# Patient Record
Sex: Female | Born: 1935
Health system: Southern US, Community
[De-identification: ages and names within clinical notes are randomized; demographics above are authoritative.]

## PROBLEM LIST (undated history)

## (undated) DIAGNOSIS — G459 Transient cerebral ischemic attack, unspecified: Secondary | ICD-10-CM

## (undated) DIAGNOSIS — E785 Hyperlipidemia, unspecified: Secondary | ICD-10-CM

## (undated) DIAGNOSIS — E039 Hypothyroidism, unspecified: Secondary | ICD-10-CM

## (undated) DIAGNOSIS — I10 Essential (primary) hypertension: Secondary | ICD-10-CM

## (undated) DIAGNOSIS — D473 Essential (hemorrhagic) thrombocythemia: Secondary | ICD-10-CM

## (undated) DIAGNOSIS — D75839 Thrombocytosis, unspecified: Secondary | ICD-10-CM

## (undated) HISTORY — DX: Essential (primary) hypertension: I10

## (undated) HISTORY — PX: HERNIA REPAIR: SHX51

## (undated) HISTORY — DX: Essential (hemorrhagic) thrombocythemia: D47.3

## (undated) HISTORY — DX: Thrombocytosis, unspecified: D75.839

## (undated) HISTORY — DX: Hyperlipidemia, unspecified: E78.5

## (undated) HISTORY — DX: Hypothyroidism, unspecified: E03.9

## (undated) HISTORY — PX: ABDOMINAL HYSTERECTOMY: SHX81

## (undated) HISTORY — DX: Transient cerebral ischemic attack, unspecified: G45.9

---

## 2001-05-18 ENCOUNTER — Ambulatory Visit (HOSPITAL_COMMUNITY): Admission: RE | Admit: 2001-05-18 | Discharge: 2001-05-18 | Payer: Self-pay | Admitting: Internal Medicine

## 2001-05-18 ENCOUNTER — Encounter: Payer: Self-pay | Admitting: Internal Medicine

## 2002-07-26 DIAGNOSIS — G459 Transient cerebral ischemic attack, unspecified: Secondary | ICD-10-CM

## 2002-07-26 HISTORY — DX: Transient cerebral ischemic attack, unspecified: G45.9

## 2002-08-14 ENCOUNTER — Ambulatory Visit (HOSPITAL_COMMUNITY): Admission: RE | Admit: 2002-08-14 | Discharge: 2002-08-14 | Payer: Self-pay | Admitting: Internal Medicine

## 2002-08-14 ENCOUNTER — Encounter: Payer: Self-pay | Admitting: Internal Medicine

## 2003-08-20 ENCOUNTER — Ambulatory Visit (HOSPITAL_COMMUNITY): Admission: RE | Admit: 2003-08-20 | Discharge: 2003-08-20 | Payer: Self-pay | Admitting: Internal Medicine

## 2004-08-21 ENCOUNTER — Ambulatory Visit (HOSPITAL_COMMUNITY): Admission: RE | Admit: 2004-08-21 | Discharge: 2004-08-21 | Payer: Self-pay | Admitting: Internal Medicine

## 2005-08-06 ENCOUNTER — Ambulatory Visit (HOSPITAL_COMMUNITY): Admission: RE | Admit: 2005-08-06 | Discharge: 2005-08-06 | Payer: Self-pay | Admitting: Internal Medicine

## 2005-08-24 ENCOUNTER — Ambulatory Visit (HOSPITAL_COMMUNITY): Admission: RE | Admit: 2005-08-24 | Discharge: 2005-08-24 | Payer: Self-pay | Admitting: Internal Medicine

## 2007-05-22 ENCOUNTER — Encounter: Payer: Self-pay | Admitting: Emergency Medicine

## 2007-05-22 ENCOUNTER — Observation Stay (HOSPITAL_COMMUNITY): Admission: AD | Admit: 2007-05-22 | Discharge: 2007-05-23 | Payer: Self-pay | Admitting: Cardiology

## 2007-05-22 ENCOUNTER — Ambulatory Visit: Payer: Self-pay | Admitting: Cardiology

## 2007-05-25 ENCOUNTER — Ambulatory Visit: Payer: Self-pay

## 2007-05-26 ENCOUNTER — Ambulatory Visit (HOSPITAL_COMMUNITY): Admission: RE | Admit: 2007-05-26 | Discharge: 2007-05-26 | Payer: Self-pay | Admitting: Internal Medicine

## 2007-06-15 ENCOUNTER — Ambulatory Visit: Payer: Self-pay | Admitting: Cardiology

## 2007-07-24 ENCOUNTER — Ambulatory Visit: Payer: Self-pay | Admitting: Cardiology

## 2007-07-24 LAB — CONVERTED CEMR LAB: CRP, High Sensitivity: 1 (ref 0.00–5.00)

## 2007-09-06 ENCOUNTER — Ambulatory Visit: Payer: Self-pay | Admitting: Cardiology

## 2008-03-19 ENCOUNTER — Ambulatory Visit: Payer: Self-pay | Admitting: Cardiology

## 2008-04-09 ENCOUNTER — Encounter: Payer: Self-pay | Admitting: Cardiology

## 2008-04-09 ENCOUNTER — Ambulatory Visit: Payer: Self-pay

## 2008-09-17 ENCOUNTER — Ambulatory Visit: Payer: Self-pay | Admitting: Cardiology

## 2009-02-05 ENCOUNTER — Encounter: Payer: Self-pay | Admitting: Cardiology

## 2009-02-12 ENCOUNTER — Encounter: Payer: Self-pay | Admitting: Cardiology

## 2009-02-14 ENCOUNTER — Telehealth: Payer: Self-pay | Admitting: Cardiology

## 2009-04-05 DIAGNOSIS — E039 Hypothyroidism, unspecified: Secondary | ICD-10-CM

## 2009-04-05 DIAGNOSIS — E7800 Pure hypercholesterolemia, unspecified: Secondary | ICD-10-CM

## 2009-04-05 DIAGNOSIS — Z8679 Personal history of other diseases of the circulatory system: Secondary | ICD-10-CM | POA: Insufficient documentation

## 2009-04-05 DIAGNOSIS — I10 Essential (primary) hypertension: Secondary | ICD-10-CM | POA: Insufficient documentation

## 2009-04-23 ENCOUNTER — Ambulatory Visit: Payer: Self-pay | Admitting: Cardiology

## 2010-04-07 ENCOUNTER — Ambulatory Visit: Payer: Self-pay | Admitting: Cardiology

## 2010-08-13 ENCOUNTER — Ambulatory Visit (HOSPITAL_COMMUNITY)
Admission: RE | Admit: 2010-08-13 | Discharge: 2010-08-13 | Payer: Self-pay | Source: Home / Self Care | Attending: Internal Medicine | Admitting: Internal Medicine

## 2010-08-16 ENCOUNTER — Encounter: Payer: Self-pay | Admitting: Internal Medicine

## 2010-08-25 NOTE — Assessment & Plan Note (Signed)
Summary: F1Y   Visit Type:  1 year follow up Primary Provider:  fagan  CC:  palpitations.  History of Present Illness: The first seven months were not too good.  During the first of the year, had alot of palpitations.  Pulse would increase with no reason.  It would be regular and fast, but BP would be ok.  She has had some lightheadedness and tingling.  She would tingle in the left hand when her BP is being taken.  Sometimes she would have mild chest pressure.  She would feel both bad and weak.  Sometimes she would also ache.  In about two hours she would be quite a bit better.  Someitmes out of a sound sleep, she would have a strange feeling in her arm..  BP was variable through all of this time.  She went to see Dr. Willey Blade, and her Zestril was lowered.  She then was watching a pattern, and noticed the pressure was widening.  She started taking folic acid, and this has helped immensely.  SHe thinks she is better, quite honestly.  She stopped the Zestril for the past 4 or 5 days now without taking.  She really does feel  better.    Current Medications (verified): 1)  Hydrochlorothiazide 25 Mg Tabs (Hydrochlorothiazide) .... Take One Tablet By Mouth Daily. 2)  Garlic [id number] Mg Caps (Garlic) .... Take 1 Capsule By Mouth Once A Day 3)  Ocuvite  Tabs (Multiple Vitamins-Minerals) .... Take 1 Tablet By Mouth Once A Day 4)  Zinc 50 Mg Tabs (Zinc) .... 3 Times A Week 5)  Vitamin C 500 Mg  Tabs (Ascorbic Acid) .... Take 1 Tablet By Mouth Once A Day 6)  Zestril 20 Mg Tabs (Lisinopril) .... Take 1 Tablet By Mouth Once A Day Hold For About 4 Days 7)  Aspirin Ec 325 Mg Tbec (Aspirin) .... Take One Tablet By Mouth Daily 8)  Synthroid 125 Mcg Tabs (Levothyroxine Sodium) .... Take 1 Tablet By Mouth Once A Day 9)  Folic Acid [id number] Mcg Tabs (Folic Acid) .... Take 1 Tablet By Mouth Once A Day  Allergies: 1)  ! Simvastatin 2)  ! Zetia (Ezetimibe) 3)  ! Premarin 4)  ! Lipitor (Atorvastatin) 5)  ! * Red Yeast  Rice 6)  ! * Flax Seed Oil 7)  ! Laurina Bustle Odor  Past History:  Past Medical History: Last updated: 04/05/2009 Current Problems:  HYPERTENSION, UNSPECIFIED (ICD-401.9) PALPITATIONS, HX OF (ICD-V12.50) HYPERCHOLESTEROLEMIA (ICD-272.0) HYPOTHYROIDISM (ICD-244.9)  Past Surgical History: Last updated: 04/05/2009  SURGICAL HISTORY:   1. Hysterectomy.   2. Hernia repair.   3. Appendectomy.     Family History: Last updated: 04/05/2009  Mother had problems resulting in a pacemaker   implantation.  father died of a heart attack  Sibling with heart disease  status post bypass.      Social History: Last updated: 04/05/2009  She lives in [place] with her family.  She denies any   tobacco, EtOH, drug or herbal medication use.  She tries to follow a low  cholesterol diet.   Vital Signs:  Patient profile:   77 year old female Height:      61 inches Weight:      133.25 pounds BMI:     25.27 Pulse rate:   82 / minute Pulse rhythm:   regular Resp:     18 per minute BP sitting:   134 / 80  (left arm) Cuff size:   large  Vitals  Entered By: Stacie Garza (April 07, 2010 2:39 PM)  Physical Exam  General:  Well developed, well nourished, in no acute distress. Head:  normocephalic and atraumatic Eyes:  PERRLA/EOM intact; conjunctiva and lids normal. Lungs:  Clear bilaterally to auscultation and percussion. Heart:  PMI non displaced. Normal S1 and S2.  No murmur.   Abdomen:  Bowel sounds positive; abdomen soft and non-tender without masses, organomegaly, or hernias noted. No hepatosplenomegaly. Pulses:  pulses normal in all 4 extremities Extremities:  No clubbing or cyanosis. Neurologic:  Alert and oriented x 3.   EKG  Procedure date:  04/07/2010  Findings:      NSR.  LAD.  No acute changes.  Impression & Recommendations:  Problem # 1:  HYPERTENSION, UNSPECIFIED (ICD-401.9) Variable.  Reviewed in detail.  Encouraged close followup with Dr. Willey Blade. Her updated medication  list for this problem includes:    Hydrochlorothiazide 25 Mg Tabs (Hydrochlorothiazide) .Marland Kitchen... Take one tablet by mouth daily.    Zestril 20 Mg Tabs (Lisinopril) .Marland Kitchen... Take 1 tablet by mouth once a day hold for about 4 days    Aspirin Ec 325 Mg Tbec (Aspirin) .Marland Kitchen... Take one tablet by mouth daily  Orders: EKG w/ Interpretation (93000)  Problem # 2:  HYPERCHOLESTEROLEMIA (ICD-272.0) Conservative management.  See prior notes.  Problem # 3:  HYPOTHYROIDISM (ICD-244.9) on replacement per Dr. Willey Blade.  Her updated medication list for this problem includes:    Synthroid 125 Mcg Tabs (Levothyroxine sodium) .Marland Kitchen... Take 1 tablet by mouth once a day  Patient Instructions: 1)  Your physician recommends that you schedule a follow-up appointment as needed. 2)  Your physician recommends that you continue on your current medications as directed. Please refer to the Current Medication list given to you today.

## 2010-12-08 NOTE — Assessment & Plan Note (Signed)
[place]                            CARDIOLOGY OFFICE NOTE   MAILIN, PRESSMAN                           MRN:          [id number]  DATE:09/06/2007                            DOB:          1927-10-03    Stacie Garza is seen back today in followup.  Since I last saw her, she has  been sleeping better without any symptoms.  She is feeling largely  stronger.   PHYSICAL EXAMINATION:  VITAL SIGNS:  Blood pressure is 125/77, pulse is  83.  LUNGS:  The lung fields are clear.  CARDIAC:  Largely unchanged.   Overall, Stacie Garza is improved.  She would like to basically try and  manage her hypercholesterolemia without any medical intervention.  She  will remain on her current medications.  We will see her back in  followup perhaps in 6 months to a year.     Loretha Brasil. Lia Foyer, MD, [facility]  Electronically Signed    TDS/MedQ  DD: 12/11/2007  DT: 12/11/2007  Job #: [id number]

## 2010-12-08 NOTE — Letter (Signed)
September 17, 2008    [name]. Willey Blade, MD  [address]  [place], [place] [address]   RE:  Stacie Garza, Stacie Garza  MRN:  [id number]  /  DOB:  [birth year]   Dear Carloyn Manner,   I had the pleasure of seeing [facility] in the office today in a  followup visit.  In general, she thinks she is doing pretty well  overall.  She had developed a lot of flatus and burping which she  attributed to taking two aspirins a day, and she thinks in her digestion  system is changed overtime.  As a result, she cut down to one aspirin a  day.  She has had several episodes of chest discomfort, one episode woke  her up one November night.  She had some numbness and tingling and as if  something went up her arm and to her chest that last about 5-10 minutes,  then subsequently went away.  There was no diaphoresis, nausea or  vomiting, or other type of cardiac symptoms.  Sometime after Christmas,  she had another episode, she went to bed at 1030 without any difficulty  and woke up at 1215 with severe tightness in the chest and that lasted  about 5-10 minutes.  She has noticed a little bit more at times with  shoveling in the snow.  She feels good the rest of the time.   CURRENT MEDICATIONS:  1. Hydrochlorothiazide 25 mg daily.  2. Coreg daily.  3. Ocuvite daily.  4. Zinc 50 mg three times a week.  5. Vitamin C.  6. Zestril 20 mg a day.  7. Synthroid 112 mcg.  8. Aspirin 325 mg daily.   On physical, she is alert and oriented, in no distress.  Weight is 130  pounds and blood pressure 139/80.  The lung fields are clear.  The  cardiac rhythm is regular.  There is no significant gallop noted.   The electrocardiogram demonstrates normal sinus rhythm with left  oriented axis, otherwise unremarkable.   To summarize, she has had some recurrent episodes of chest discomfort.  She does not seem to concerned about and she had a negative radionuclide  imaging study previously, with a well-preserved ejection fraction.  We  have  talked in the past about possibility of cardiac catheterization,  but I certainly did not push her on this issue.  She personally thinks  that this is not cardiac related, and wonders whether she puts herself  under too much stress.  She would like to defer any other type of  evaluation at the present time.  She thinks if the episodes would  increase, and then she said she would call me and we would re-discuss  any kind of further evaluation.  I did suggest that she could probably  get the same antiplatelet effect with 81 mg and then some of her GI  symptoms could be related to the aspirin itself.  However, she wishes to  persist with 325.   We will see her back in followup in 6 months to a year, but I would be  happy to see her earlier further problems would arise.    Sincerely,      Loretha Brasil. Lia Foyer, MD, [facility]  Electronically Signed    TDS/MedQ  DD: 09/17/2008  DT: 09/18/2008  Job #: [id number]

## 2010-12-08 NOTE — Discharge Summary (Signed)
Stacie Garza, MCCLAINE                  ACCOUNT NO.:  [id number]   MEDICAL RECORD NO.:  [id number]          PATIENT TYPE:  INP   LOCATION:  3733                         FACILITY:  [facility]   PHYSICIAN:  Loretha Brasil. Lia Foyer, MD, FACCDATE OF BIRTH:  07-26-1928   DATE OF ADMISSION:  05/22/2007  DATE OF DISCHARGE:  05/23/2007                               DISCHARGE SUMMARY   PRIMARY CARDIOLOGIST:  Dr. Addison Lank.   PRIMARY CARE Jamilya Sarrazin:  Dr. Willey Blade.   PROCEDURES PERFORMED:  None.   FINAL DISCHARGE DIAGNOSES:  1. Chest pain, atypical.      a.     Pain located in the left back, left axillary discomfort,       with numbness and tingling in the left arm, worsened with       inspiration.  2. Dizziness, with and without statins.  3. History of hypertension.  4. Hypercholesterolemia not tolerant of statins.   HOSPITAL COURSE:  This is a very pleasant 77 year old Caucasian female  with no known history of coronary artery disease but with risk factors  to include family history of hypertension and hypercholesterolemia, who  was seen by her primary care physician, Dr. Willey Blade, with complaints of  dizziness and increased weakness.  The patient was referred to Dr.  Lia Foyer for further evaluation, and an appointment was scheduled with  him.  However, over the weekend prior to admission, she experienced  increased weakness and dizziness, and began to experience some left  shoulder discomfort associated with back pain and left axillary  discomfort, with some numbness and tingling in her left arm.  She states  that this has been going on intermittently for the last 2 months,  however over the weekend, the discomfort in her back and left arm would  actually wake her up, and it lasted approximately a day and a half, and  would move around on her back.  She does have a history of shingles, but  she felt like there was no break-out at that time, but she did have some  pain.  The patient went to [facility] for evaluation secondary  to the discomfort in her chest, back, and left arm.  EKG showed no acute  ST-T wave changes, and point of care markers were negative.  The patient  had a CT head done at [facility] revealing no acute findings.   The patient was transferred to [facility] for further  evaluation in the setting of chest discomfort, with strong family  history, and was seen and examined by Rosanne Sack, nurse  practitioner, and Kirk Ruths, cardiologist.  The patient was kept  overnight to rule out myocardial infarction and planned outpatient  Myoview if tests were found to be negative.   The patient did have some mild hypertension during hospitalization with  blood pressure in the 0000000 systolic.  Normally, it runs in the 120s.  She is on antihypertensive medications.  Cardiac enzymes are found to be  negative at 0.01, and 0.01 with CK of 40 and 42 respectively, and MB of  0.7 and  0.7 respectively.  D-dimer was found to be 0.40.  The patient's  potassium was found to be within normal range at 3.5.  EKG revealed  sinus rhythm with left anterior fascicular block and frequent PVCs.   The patient was planned for adenosine stress Myoview on the morning of  May 23, 2007, however the patient was not discharged in time to  undergo this test today, and therefore it has been rescheduled for later  this week.  The patient has had no recurrence of chest discomfort, and  also confirms that the patient has been stable throughout the last 24  hours.  The patient is anxious to go home, and is willing to return for  stress test on Thursday.  Patient has been seen and examined by Dr. Addison Lank, and he has also interviewed her as well, and agrees with  planned outpatient stress Myoview.   Labs on discharge are sodium 136, potassium 3.5, chloride 100, CO2 26,  BUN 15, creatinine 0.7, glucose 90, hemoglobin 15.2, hematocrit 45.6,  white blood cells 6.2, platelets 667.   D-dimer 0.40, troponins as stated  above negative x2.  EKG reveals sinus rhythm with frequent PVCs.  Chest  x-ray revealed bibasilar atelectasis.   DISCHARGE MEDICATIONS:  1. Aspirin 325 mg one p.o. daily.  2. Protonix 40 mg one p.o. daily.  3. Hydrochlorothiazide 25 mg daily.  4. Lisinopril 20 mg daily.  5. Levothyroxine 125 mcg daily.   ALLERGIES:  SIMVASTATIN, ESTROGENS, CONJUGATED; ZETIA.   FOLLOWUP PLANS AND APPOINTMENTS:  1. The patient will follow up with stress Myoview per Dr. Lia Foyer on      May 25, 2007 at 9:15.  2. The patient will follow up with Dr. Lia Foyer for followup      appointment on June 15, 2007.  3. We will follow up with CBC prior to discharge secondary to high      platelet count and possible need for referral to hematologist if it      remains elevated.  4. The patient is to follow up with Dr. Willey Blade, her primary care      physician, for continued medical management.   Time spent with the patient to include physician time 50 minutes.      Phill Myron. Purcell Nails, NP      Loretha Brasil. Lia Foyer, MD, [facility]  Electronically Signed    KML/MEDQ  D:  05/23/2007  T:  05/23/2007  Job:  [id number]   cc:   [name]. Willey Blade, MD

## 2010-12-08 NOTE — H&P (Signed)
Stacie Garza, Stacie Garza                  ACCOUNT NO.:  [id number]   MEDICAL RECORD NO.:  [id number]          PATIENT TYPE:  INP   LOCATION:  3733                         FACILITY:  [place]   PHYSICIAN:  Denice Bors. Stanford Breed, MD, FACCDATE OF BIRTH:  [birth year]   DATE OF ADMISSION:  05/22/2007  DATE OF DISCHARGE:                              HISTORY & PHYSICAL   PRIMARY CARDIOLOGIST:  Loretha Brasil. Lia Foyer, MD, F.A.C.C.   PRIMARY CARE PHYSICIAN:  [name]. Willey Blade, M.D.   HISTORY OF PRESENT ILLNESS:  Ms. Sweazy is a pleasant 77 year old female  with no known history of coronary artery disease who initially presented  to her primary care physician over the last few weeks complaining of  dizziness and increased weakness.  Ms. Lass had been referred to Dr.  Lia Foyer for further evaluation after that appointment was scheduled with  him for workup.  However this past weekend she experienced the increased  weakness with dizziness and then began to experience some left shoulder  discomfort associated with back pain and left axillary discomfort.  She  states this has been going on intermittently for approximately two  months.  However, this weekend the discomfort in her back and left  axillary woke her up.  Sunday same scenario with increased weakness.  She states she stayed in bed all day.  She is a rather poor historian.  Apparently the discomfort was localized to her back, but then radiated  around to her rib cage and then under her left breast some.  She states  it is difficult to take a deep breath.  The dizziness is not new.  This  has been going on for several months.  The comfort in her chest and  back, however, is new.  As I stated earlier, it is worse with movement.  The discomfort in her back is worse with movement.  Today she still  having the chest discomfort and back discomfort and it is worse with a  deep breath.  Around 3:30 this morning she woke up with some left arm  numbness, fell back to sleep  and woke up again at 6:30 a.m. with the  same complaint, decided to be evaluated and presented to [facility].  There EKG was done that showed no acute ST or T-wave changes.  Point of care markers were negative.  Blood pressure was checked.  The  patient also had orthostatics.  Lying blood pressure 111/73 with a heart  rate of 78, sitting 115/75 with a heart rate of 96 and standing blood  pressure 125/80 with a heart rate of 112.  Ms. Jerome also had a CT of the  head done at [facility] that showed no acute findings.   ALLERGIES:  She is intolerant of PREMARIN, STATINS, FLAXSEED OIL, ZETIA  RED YEAST RICE, AND LIPITOR.  She states the statins cause muscle  discomfort and Premarin causes loss of memory.   MEDICATIONS AT HOME:  Doses unclear, the patient cannot recall, but she  is taking:  1. Synthroid.  2. HCTZ.  3. Zestril.  4  Aspirin.  Apparently she is taking two 325 mg aspirin daily.  1. She is also taking zinc.  2. Garlic.  3. Multivitamin.  4. Vitamin C.   PAST MEDICAL HISTORY:  1. Hypertension.  2. Hypothyroidism.  3. High cholesterol.  4. Palpitations/PVCs.   SURGICAL HISTORY:  1. Hysterectomy.  2. Hernia repair.  3. Appendectomy.   SOCIAL HISTORY:  She lives in [place] with her family.  She denies any  tobacco, EtOH, drug or herbal medication use.  She tries to follow a low  cholesterol diet.   FAMILY HISTORY:  Mother had problems resulting in a pacemaker  implantation.  Father had heart disease.  Sibling with heart disease  status post bypass.   REVIEW OF SYSTEMS:  Positive for chest pain, palpitations, dizziness,  numbness in left arm and shoulder, myalgia, arthralgias in knees and  ache in back as described above and intermittent nausea.   PHYSICAL EXAMINATION:  VITAL SIGNS:  Temperature 96.9, respirations 20,  blood pressure 120/70.  She satting 95% on room air.  GENERALLY:  She is in no acute distress.  HEENT:  Normocephalic, atraumatic.  Pupils  equal, round, react to light.  Sclerae is clear.  NECK:  Supple without bruits or JVD.  CARDIOVASCULAR:  Exam reveals S1, S2 regular rate and rhythm.  She has a  2/6 systolic ejection murmur.  LUNGS: Clear to auscultation.  SKIN:  Warm and dry.  ABDOMEN:  Soft, nontender, positive bowel sounds.  LOWER EXTREMITIES: Without clubbing, cyanosis or edema.  NEUROLOGICALLY:  Alert and oriented x3.  MUSCULOSKELETAL: Without effusions or CVA tenderness.   LABORATORY DATA:  Chest x-ray done at [facility] showed bronchitic  changes with linear subsegmental atelectasis at both lung bases.  CT of  the head without acute findings.  EKG at [facility] showed normal sinus  rhythm with left anterior fascicular block.  No ST changes.  Baseline  lab work obtained at [facility] shows H&H 15.2 and 45.6, WBC 6.2,  platelets 667,000.  Sodium 140, potassium 3.9, BUN and creatinine 13 and  0.8, glucose 114.  D-dimer 0.40.  Point of care less than 0.05 troponin.   Dr. Kirk Ruths in to examine and assess patient with complaints of  atypical chest and back discomfort persistent greater than 48 hours, EKG  without acute ST or T-wave changes.  Initial enzymes negative.  Pain  very  atypical.  It may be musculoskeletal.  The patient will be admitted for  observation, rule out MI.  If enzymes negative, proceed with outpatient  Myoview echo and carotid Dopplers per Dr. Maren Beach request.  Will keep  the patient n.p.o. overnight.  Hopefully can obtain a stress Myoview at  our office in the a.m.      Rosanne Sack, ACNP      Denice Bors. Stanford Breed, MD, [facility]  Electronically Signed    MB/MEDQ  D:  05/22/2007  T:  05/23/2007  Job:  [id number]

## 2010-12-08 NOTE — Letter (Signed)
March 19, 2008    [name]. Willey Blade, MD  [address]  [place], [place] [address]   RE:  Stacie, Garza  MRN:  [id number]  /  DOB:  November 04, 1927   Dear Carloyn Manner,   I had the pleasure of seeing [name] in the office today in a  followup visit.  In general, she says she is doing a lot better than she  did a year ago.  She does note that she does not wake up easily.  She  relates this did effect that her father died of a heart attack many  years ago.  She says that she will feel a little bit of a tightness  across the chest and neck and that will go away and has gone by about 9  to 9:30, rarely lasts very long.  As you know, she had had a  radionuclide imaging study in October of last year, and this did not  suggest high-grade ischemia.  She actually thinks this is fairly minor  and wonders whether it could be related factor that her thyroid was  recently slightly overactive.  I know that her dose has been cut back to  12 mcg daily for a TSH that was slightly low.  She says she feels her  palpitations at that time is well.  When she is up at around in the  afternoon, she has no exertional chest discomfort.   Today on examination, she was alert and oriented.  The weight was 129,  blood pressure 122/70, the pulse was 69.  The lung fields were clear and  the cardiac rhythm was regular.   The electrocardiogram demonstrates normal sinus rhythm with a rare PVC.  There is a leftward-oriented axis with left anterior fascicular block.   Her recent studies include a normal BUN and creatinine, potassium of  4.4, cholesterol now of 198 with an LDL of 135.  Her platelet count was  elevated at 531, which has been previously noted, and her TSH was 0.103.   [name] and I had a thorough discussion today.  I brought up the issue  a possible consideration of cardiac catheterization if her symptoms  persist particularly, but she has not really interested in this at the  present time.  She really felt that  this was fairly minor.  If her  symptoms do not improve with backing off of her Synthroid, then it would  be worthwhile to consider.  We will go ahead and get a 2-D  echocardiogram which has not been done previously, and I will have her  return to clinic in 4-6 months.  With regard to her lipid abnormalities,  certainly I would defer to your judgment.  I do appreciate the  opportunity of sharing in this nice lady's care and she is to call us  promptly if there is a change in her status.    Sincerely,      Loretha Brasil. Lia Foyer, MD, [facility]  Electronically Signed    TDS/MedQ  DD: 03/19/2008  DT: 03/20/2008  Job #: [id number]

## 2010-12-08 NOTE — Letter (Signed)
June 15, 2007    Stacie Garza. Stacie Blade, MD  [address]  [place], [place] [address]   RE:  Stacie Garza, Stacie Garza  MRN:  [id number]  /  DOB:  April 17, 1928   Dear Stacie Garza:   I had the pleasure of seeing Stacie Garza in the office today in followup.  I spent nearly an hour with she and her daughter going over all of their  recent laboratory studies in detail.  She actually thinks a lavender  pillow that she got nearly a year ago might be the source of her  problem, and she is feeling much back to normal.  Her foggy-headedness,  dizziness, and other symptoms seem to be rather substantially improved.  As you know, she has had some other studies done.  You have gotten  carotid studies and these have demonstrated significant plaque formation  bilaterally, the carotid bulbs.  She did undergo radionuclide imaging  study, and this demonstrated ejection fraction of 85%.  There was normal  to hyperdynamic regional and global left ventricular function.  She did  have some mild chest discomfort, but no significant ST segment change.  The findings were suggestive of some tissue attenuation without a large  degree of ischemia.   Fortunately, she is significantly better.   MEDICATIONS:  1. Aspirin 325 mg daily.  2. Protonix 40 mg daily.  3. Hydrochlorothiazide 25 mg daily.  4. Lisinopril 20 mg daily.  5. Levothyroxine 125 mcg daily.  6. Garlic 1 daily.  7. Ocuvite multivitamin daily.  8. Zinc 50 mg 3 times a week.  9. Vitamin C 500 mg daily.   PHYSICAL EXAMINATION:  She is alert and oriented in no acute distress.  Blood pressure is 127/70, pulse is 76.  There are no carotid bruits noted.   I went through things with her in some great detail.  She has, as you  know, evidence of hypercholesterolemia with an LDL of 148, and evidence  of significant bilateral plaque.  She does not have a perfusion defect,  but I did clearly express to her the fact that this does not exclude  underlying coronary disease,  given the extent of the carotid disease, I  thought that it would be worthwhile again to consider the issue of  statins.  She has not had a very good experience with them.  We talked  about it, the number of the trials, including Reversal and Asteroid.  She seemed to be reasonably open minded to the possibility of taking a  statin drug.  She also brought in to me her platelet counts which have  been significantly elevated since 1962.  This is somewhat comforting.   We have talked about the possibility of initiating therapy with Crestor  5 mg.  We will get a CRP and we will see what the results of this are  initially, first.  We did, also, have a long discussion regarding the  Jupiter trial results.  I appreciate the opportunity of sharing in her  care, and will communicate with you if we see her back.    Sincerely,      Stacie Garza. Stacie Foyer, MD, [facility]  Electronically Signed    TDS/MedQ  DD: 06/16/2007  DT: 06/17/2007  Job #: [id number]

## 2011-05-05 LAB — DIFFERENTIAL
Basophils Relative: 1
Lymphocytes Relative: 24
Monocytes Absolute: 0.7
Monocytes Relative: 12 — ABNORMAL HIGH
Neutro Abs: 3.8
Neutrophils Relative %: 61

## 2011-05-05 LAB — COMPREHENSIVE METABOLIC PANEL
Albumin: 3.7
Alkaline Phosphatase: 98
BUN: 13
Calcium: 8.7
Creatinine, Ser: 0.83
Glucose, Bld: 114 — ABNORMAL HIGH
Total Protein: 6.8

## 2011-05-05 LAB — BASIC METABOLIC PANEL
CO2: 26
Calcium: 8.3 — ABNORMAL LOW
Creatinine, Ser: 0.73
GFR calc Af Amer: >60
GFR calc non Af Amer: >60
Glucose, Bld: 90
Sodium: 136

## 2011-05-05 LAB — CARDIAC PANEL(CRET KIN+CKTOT+MB+TROPI)
CK, MB: 0.7
Total CK: 40
Total CK: 42
Total CK: 45
Troponin I: 0.01

## 2011-05-05 LAB — POCT CARDIAC MARKERS
CKMB, poc: <1 — ABNORMAL LOW
Myoglobin, poc: 48.4
Operator id: 264761

## 2011-05-05 LAB — HEMOGLOBIN A1C
Hgb A1c MFr Bld: 5.8
Mean Plasma Glucose: 129

## 2011-05-05 LAB — CBC
HCT: 45.6
Hemoglobin: 15.2 — ABNORMAL HIGH
MCHC: 33.4
MCV: 96.6
Platelets: 667 — ABNORMAL HIGH
RDW: 13.3

## 2011-09-01 DIAGNOSIS — E039 Hypothyroidism, unspecified: Secondary | ICD-10-CM | POA: Diagnosis not present

## 2011-09-09 DIAGNOSIS — E039 Hypothyroidism, unspecified: Secondary | ICD-10-CM | POA: Diagnosis not present

## 2011-11-30 DIAGNOSIS — E039 Hypothyroidism, unspecified: Secondary | ICD-10-CM | POA: Diagnosis not present

## 2011-12-17 DIAGNOSIS — E039 Hypothyroidism, unspecified: Secondary | ICD-10-CM | POA: Diagnosis not present

## 2012-01-31 DIAGNOSIS — Z961 Presence of intraocular lens: Secondary | ICD-10-CM | POA: Diagnosis not present

## 2012-01-31 DIAGNOSIS — H52209 Unspecified astigmatism, unspecified eye: Secondary | ICD-10-CM | POA: Diagnosis not present

## 2012-02-01 DIAGNOSIS — C44320 Squamous cell carcinoma of skin of unspecified parts of face: Secondary | ICD-10-CM | POA: Diagnosis not present

## 2012-02-01 DIAGNOSIS — L821 Other seborrheic keratosis: Secondary | ICD-10-CM | POA: Diagnosis not present

## 2012-02-01 DIAGNOSIS — D485 Neoplasm of uncertain behavior of skin: Secondary | ICD-10-CM | POA: Diagnosis not present

## 2012-02-01 DIAGNOSIS — Z85828 Personal history of other malignant neoplasm of skin: Secondary | ICD-10-CM | POA: Diagnosis not present

## 2012-02-01 DIAGNOSIS — D239 Other benign neoplasm of skin, unspecified: Secondary | ICD-10-CM | POA: Diagnosis not present

## 2012-02-29 DIAGNOSIS — C44320 Squamous cell carcinoma of skin of unspecified parts of face: Secondary | ICD-10-CM | POA: Diagnosis not present

## 2012-03-01 DIAGNOSIS — Z79899 Other long term (current) drug therapy: Secondary | ICD-10-CM | POA: Diagnosis not present

## 2012-03-01 DIAGNOSIS — E785 Hyperlipidemia, unspecified: Secondary | ICD-10-CM | POA: Diagnosis not present

## 2012-03-01 DIAGNOSIS — E039 Hypothyroidism, unspecified: Secondary | ICD-10-CM | POA: Diagnosis not present

## 2012-03-01 DIAGNOSIS — I10 Essential (primary) hypertension: Secondary | ICD-10-CM | POA: Diagnosis not present

## 2012-03-09 DIAGNOSIS — E785 Hyperlipidemia, unspecified: Secondary | ICD-10-CM | POA: Diagnosis not present

## 2012-03-09 DIAGNOSIS — E039 Hypothyroidism, unspecified: Secondary | ICD-10-CM | POA: Diagnosis not present

## 2012-03-09 DIAGNOSIS — I10 Essential (primary) hypertension: Secondary | ICD-10-CM | POA: Diagnosis not present

## 2012-07-12 DIAGNOSIS — I10 Essential (primary) hypertension: Secondary | ICD-10-CM | POA: Diagnosis not present

## 2012-07-12 DIAGNOSIS — E039 Hypothyroidism, unspecified: Secondary | ICD-10-CM | POA: Diagnosis not present

## 2012-07-12 DIAGNOSIS — Z0000 Encounter for general adult medical examination without abnormal findings: Secondary | ICD-10-CM | POA: Diagnosis not present

## 2012-07-12 DIAGNOSIS — E785 Hyperlipidemia, unspecified: Secondary | ICD-10-CM | POA: Diagnosis not present

## 2012-07-31 DIAGNOSIS — Z23 Encounter for immunization: Secondary | ICD-10-CM | POA: Diagnosis not present

## 2012-09-05 DIAGNOSIS — E039 Hypothyroidism, unspecified: Secondary | ICD-10-CM | POA: Diagnosis not present

## 2013-01-31 DIAGNOSIS — Z961 Presence of intraocular lens: Secondary | ICD-10-CM | POA: Diagnosis not present

## 2013-01-31 DIAGNOSIS — H52209 Unspecified astigmatism, unspecified eye: Secondary | ICD-10-CM | POA: Diagnosis not present

## 2013-02-27 DIAGNOSIS — I10 Essential (primary) hypertension: Secondary | ICD-10-CM | POA: Diagnosis not present

## 2013-02-27 DIAGNOSIS — E785 Hyperlipidemia, unspecified: Secondary | ICD-10-CM | POA: Diagnosis not present

## 2013-02-27 DIAGNOSIS — E039 Hypothyroidism, unspecified: Secondary | ICD-10-CM | POA: Diagnosis not present

## 2013-03-06 ENCOUNTER — Other Ambulatory Visit (HOSPITAL_COMMUNITY): Payer: Self-pay | Admitting: Family Medicine

## 2013-03-06 DIAGNOSIS — Z139 Encounter for screening, unspecified: Secondary | ICD-10-CM

## 2013-03-06 DIAGNOSIS — E039 Hypothyroidism, unspecified: Secondary | ICD-10-CM | POA: Diagnosis not present

## 2013-03-06 DIAGNOSIS — Z23 Encounter for immunization: Secondary | ICD-10-CM | POA: Diagnosis not present

## 2013-03-06 DIAGNOSIS — Z0000 Encounter for general adult medical examination without abnormal findings: Secondary | ICD-10-CM | POA: Diagnosis not present

## 2013-03-06 DIAGNOSIS — I10 Essential (primary) hypertension: Secondary | ICD-10-CM | POA: Diagnosis not present

## 2013-03-13 ENCOUNTER — Ambulatory Visit (HOSPITAL_COMMUNITY)
Admission: RE | Admit: 2013-03-13 | Discharge: 2013-03-13 | Disposition: A | Discharge status: Home / Self Care | Payer: Medicare Other | Source: Ambulatory Visit | Attending: Family Medicine | Admitting: Family Medicine

## 2013-03-13 DIAGNOSIS — Z1231 Encounter for screening mammogram for malignant neoplasm of breast: Secondary | ICD-10-CM | POA: Insufficient documentation

## 2013-03-13 DIAGNOSIS — Z139 Encounter for screening, unspecified: Secondary | ICD-10-CM

## 2013-05-15 DIAGNOSIS — Z23 Encounter for immunization: Secondary | ICD-10-CM | POA: Diagnosis not present

## 2013-06-12 DIAGNOSIS — L723 Sebaceous cyst: Secondary | ICD-10-CM | POA: Diagnosis not present

## 2013-06-12 DIAGNOSIS — Z85828 Personal history of other malignant neoplasm of skin: Secondary | ICD-10-CM | POA: Diagnosis not present

## 2013-06-12 DIAGNOSIS — L570 Actinic keratosis: Secondary | ICD-10-CM | POA: Diagnosis not present

## 2013-09-04 DIAGNOSIS — I10 Essential (primary) hypertension: Secondary | ICD-10-CM | POA: Diagnosis not present

## 2013-09-04 DIAGNOSIS — E785 Hyperlipidemia, unspecified: Secondary | ICD-10-CM | POA: Diagnosis not present

## 2013-09-04 DIAGNOSIS — E039 Hypothyroidism, unspecified: Secondary | ICD-10-CM | POA: Diagnosis not present

## 2013-09-04 DIAGNOSIS — D473 Essential (hemorrhagic) thrombocythemia: Secondary | ICD-10-CM | POA: Diagnosis not present

## 2013-09-05 DIAGNOSIS — D473 Essential (hemorrhagic) thrombocythemia: Secondary | ICD-10-CM | POA: Diagnosis not present

## 2013-09-05 DIAGNOSIS — E039 Hypothyroidism, unspecified: Secondary | ICD-10-CM | POA: Diagnosis not present

## 2013-09-05 DIAGNOSIS — E785 Hyperlipidemia, unspecified: Secondary | ICD-10-CM | POA: Diagnosis not present

## 2013-09-05 DIAGNOSIS — I10 Essential (primary) hypertension: Secondary | ICD-10-CM | POA: Diagnosis not present

## 2013-10-08 DIAGNOSIS — D473 Essential (hemorrhagic) thrombocythemia: Secondary | ICD-10-CM | POA: Diagnosis not present

## 2013-12-19 DIAGNOSIS — Z85828 Personal history of other malignant neoplasm of skin: Secondary | ICD-10-CM | POA: Diagnosis not present

## 2013-12-19 DIAGNOSIS — L723 Sebaceous cyst: Secondary | ICD-10-CM | POA: Diagnosis not present

## 2013-12-19 DIAGNOSIS — L255 Unspecified contact dermatitis due to plants, except food: Secondary | ICD-10-CM | POA: Diagnosis not present

## 2013-12-19 DIAGNOSIS — D239 Other benign neoplasm of skin, unspecified: Secondary | ICD-10-CM | POA: Diagnosis not present

## 2013-12-19 DIAGNOSIS — L570 Actinic keratosis: Secondary | ICD-10-CM | POA: Diagnosis not present

## 2014-02-11 DIAGNOSIS — Z961 Presence of intraocular lens: Secondary | ICD-10-CM | POA: Diagnosis not present

## 2014-02-11 DIAGNOSIS — H52209 Unspecified astigmatism, unspecified eye: Secondary | ICD-10-CM | POA: Diagnosis not present

## 2014-03-12 DIAGNOSIS — Z0000 Encounter for general adult medical examination without abnormal findings: Secondary | ICD-10-CM | POA: Diagnosis not present

## 2014-03-12 DIAGNOSIS — I10 Essential (primary) hypertension: Secondary | ICD-10-CM | POA: Diagnosis not present

## 2014-03-12 DIAGNOSIS — E039 Hypothyroidism, unspecified: Secondary | ICD-10-CM | POA: Diagnosis not present

## 2014-03-12 DIAGNOSIS — E785 Hyperlipidemia, unspecified: Secondary | ICD-10-CM | POA: Diagnosis not present

## 2014-03-19 DIAGNOSIS — E782 Mixed hyperlipidemia: Secondary | ICD-10-CM | POA: Diagnosis not present

## 2014-03-19 DIAGNOSIS — I10 Essential (primary) hypertension: Secondary | ICD-10-CM | POA: Diagnosis not present

## 2014-03-19 DIAGNOSIS — D473 Essential (hemorrhagic) thrombocythemia: Secondary | ICD-10-CM | POA: Diagnosis not present

## 2014-04-09 DIAGNOSIS — E785 Hyperlipidemia, unspecified: Secondary | ICD-10-CM | POA: Diagnosis not present

## 2014-04-09 DIAGNOSIS — I10 Essential (primary) hypertension: Secondary | ICD-10-CM | POA: Diagnosis not present

## 2014-04-09 DIAGNOSIS — E039 Hypothyroidism, unspecified: Secondary | ICD-10-CM | POA: Diagnosis not present

## 2014-04-15 DIAGNOSIS — D691 Qualitative platelet defects: Secondary | ICD-10-CM | POA: Diagnosis not present

## 2014-04-15 DIAGNOSIS — R0989 Other specified symptoms and signs involving the circulatory and respiratory systems: Secondary | ICD-10-CM | POA: Diagnosis not present

## 2014-04-15 DIAGNOSIS — D473 Essential (hemorrhagic) thrombocythemia: Secondary | ICD-10-CM | POA: Diagnosis not present

## 2014-04-18 DIAGNOSIS — I4891 Unspecified atrial fibrillation: Secondary | ICD-10-CM | POA: Diagnosis not present

## 2014-04-18 DIAGNOSIS — I10 Essential (primary) hypertension: Secondary | ICD-10-CM | POA: Diagnosis not present

## 2014-04-18 DIAGNOSIS — R9431 Abnormal electrocardiogram [ECG] [EKG]: Secondary | ICD-10-CM | POA: Diagnosis not present

## 2014-04-18 DIAGNOSIS — R42 Dizziness and giddiness: Secondary | ICD-10-CM | POA: Diagnosis not present

## 2014-04-25 DIAGNOSIS — G459 Transient cerebral ischemic attack, unspecified: Secondary | ICD-10-CM | POA: Diagnosis not present

## 2014-05-07 DIAGNOSIS — I10 Essential (primary) hypertension: Secondary | ICD-10-CM | POA: Diagnosis not present

## 2014-05-07 DIAGNOSIS — E039 Hypothyroidism, unspecified: Secondary | ICD-10-CM | POA: Diagnosis not present

## 2014-05-07 DIAGNOSIS — Z23 Encounter for immunization: Secondary | ICD-10-CM | POA: Diagnosis not present

## 2014-05-07 DIAGNOSIS — E782 Mixed hyperlipidemia: Secondary | ICD-10-CM | POA: Diagnosis not present

## 2014-05-09 DIAGNOSIS — G459 Transient cerebral ischemic attack, unspecified: Secondary | ICD-10-CM | POA: Diagnosis not present

## 2014-05-28 DIAGNOSIS — D473 Essential (hemorrhagic) thrombocythemia: Secondary | ICD-10-CM | POA: Diagnosis not present

## 2014-05-28 DIAGNOSIS — R42 Dizziness and giddiness: Secondary | ICD-10-CM | POA: Diagnosis not present

## 2014-06-04 DIAGNOSIS — I10 Essential (primary) hypertension: Secondary | ICD-10-CM | POA: Diagnosis not present

## 2014-06-04 DIAGNOSIS — E785 Hyperlipidemia, unspecified: Secondary | ICD-10-CM | POA: Diagnosis not present

## 2014-06-04 DIAGNOSIS — E039 Hypothyroidism, unspecified: Secondary | ICD-10-CM | POA: Diagnosis not present

## 2014-06-10 DIAGNOSIS — E782 Mixed hyperlipidemia: Secondary | ICD-10-CM | POA: Diagnosis not present

## 2014-06-10 DIAGNOSIS — I10 Essential (primary) hypertension: Secondary | ICD-10-CM | POA: Diagnosis not present

## 2014-06-11 DIAGNOSIS — I6782 Cerebral ischemia: Secondary | ICD-10-CM | POA: Diagnosis not present

## 2014-06-11 DIAGNOSIS — R42 Dizziness and giddiness: Secondary | ICD-10-CM | POA: Diagnosis not present

## 2014-06-25 DIAGNOSIS — D239 Other benign neoplasm of skin, unspecified: Secondary | ICD-10-CM | POA: Diagnosis not present

## 2014-06-25 DIAGNOSIS — L570 Actinic keratosis: Secondary | ICD-10-CM | POA: Diagnosis not present

## 2014-06-25 DIAGNOSIS — L821 Other seborrheic keratosis: Secondary | ICD-10-CM | POA: Diagnosis not present

## 2014-06-25 DIAGNOSIS — L723 Sebaceous cyst: Secondary | ICD-10-CM | POA: Diagnosis not present

## 2014-06-25 DIAGNOSIS — Z808 Family history of malignant neoplasm of other organs or systems: Secondary | ICD-10-CM | POA: Diagnosis not present

## 2014-06-27 DIAGNOSIS — I10 Essential (primary) hypertension: Secondary | ICD-10-CM | POA: Diagnosis not present

## 2014-06-29 DIAGNOSIS — H18831 Recurrent erosion of cornea, right eye: Secondary | ICD-10-CM | POA: Diagnosis not present

## 2014-07-01 DIAGNOSIS — H18831 Recurrent erosion of cornea, right eye: Secondary | ICD-10-CM | POA: Diagnosis not present

## 2014-07-03 DIAGNOSIS — R2689 Other abnormalities of gait and mobility: Secondary | ICD-10-CM | POA: Diagnosis not present

## 2014-07-08 DIAGNOSIS — I10 Essential (primary) hypertension: Secondary | ICD-10-CM | POA: Diagnosis not present

## 2014-09-03 DIAGNOSIS — Z87898 Personal history of other specified conditions: Secondary | ICD-10-CM | POA: Diagnosis not present

## 2014-09-03 DIAGNOSIS — I110 Hypertensive heart disease with heart failure: Secondary | ICD-10-CM | POA: Diagnosis not present

## 2014-10-08 DIAGNOSIS — R7301 Impaired fasting glucose: Secondary | ICD-10-CM | POA: Diagnosis not present

## 2014-10-08 DIAGNOSIS — E039 Hypothyroidism, unspecified: Secondary | ICD-10-CM | POA: Diagnosis not present

## 2014-10-08 DIAGNOSIS — I10 Essential (primary) hypertension: Secondary | ICD-10-CM | POA: Diagnosis not present

## 2014-10-09 DIAGNOSIS — D691 Qualitative platelet defects: Secondary | ICD-10-CM | POA: Diagnosis not present

## 2014-10-09 DIAGNOSIS — D473 Essential (hemorrhagic) thrombocythemia: Secondary | ICD-10-CM | POA: Diagnosis not present

## 2014-10-14 DIAGNOSIS — Z6823 Body mass index (BMI) 23.0-23.9, adult: Secondary | ICD-10-CM | POA: Diagnosis not present

## 2014-10-14 DIAGNOSIS — E039 Hypothyroidism, unspecified: Secondary | ICD-10-CM | POA: Diagnosis not present

## 2014-10-14 DIAGNOSIS — R7301 Impaired fasting glucose: Secondary | ICD-10-CM | POA: Diagnosis not present

## 2014-10-14 DIAGNOSIS — I10 Essential (primary) hypertension: Secondary | ICD-10-CM | POA: Diagnosis not present

## 2014-11-12 DIAGNOSIS — E039 Hypothyroidism, unspecified: Secondary | ICD-10-CM | POA: Diagnosis not present

## 2014-11-12 DIAGNOSIS — R7301 Impaired fasting glucose: Secondary | ICD-10-CM | POA: Diagnosis not present

## 2014-11-12 DIAGNOSIS — E782 Mixed hyperlipidemia: Secondary | ICD-10-CM | POA: Diagnosis not present

## 2014-11-12 DIAGNOSIS — I10 Essential (primary) hypertension: Secondary | ICD-10-CM | POA: Diagnosis not present

## 2014-12-25 DIAGNOSIS — L821 Other seborrheic keratosis: Secondary | ICD-10-CM | POA: Diagnosis not present

## 2014-12-25 DIAGNOSIS — D225 Melanocytic nevi of trunk: Secondary | ICD-10-CM | POA: Diagnosis not present

## 2014-12-25 DIAGNOSIS — L570 Actinic keratosis: Secondary | ICD-10-CM | POA: Diagnosis not present

## 2014-12-25 DIAGNOSIS — Z85828 Personal history of other malignant neoplasm of skin: Secondary | ICD-10-CM | POA: Diagnosis not present

## 2014-12-25 DIAGNOSIS — Z808 Family history of malignant neoplasm of other organs or systems: Secondary | ICD-10-CM | POA: Diagnosis not present

## 2015-02-17 DIAGNOSIS — Z961 Presence of intraocular lens: Secondary | ICD-10-CM | POA: Diagnosis not present

## 2015-02-17 DIAGNOSIS — H524 Presbyopia: Secondary | ICD-10-CM | POA: Diagnosis not present

## 2015-03-25 DIAGNOSIS — R0602 Shortness of breath: Secondary | ICD-10-CM | POA: Diagnosis not present

## 2015-03-25 DIAGNOSIS — R42 Dizziness and giddiness: Secondary | ICD-10-CM | POA: Diagnosis not present

## 2015-03-25 DIAGNOSIS — I10 Essential (primary) hypertension: Secondary | ICD-10-CM | POA: Diagnosis not present

## 2015-04-01 DIAGNOSIS — R42 Dizziness and giddiness: Secondary | ICD-10-CM | POA: Diagnosis not present

## 2015-04-01 DIAGNOSIS — R0602 Shortness of breath: Secondary | ICD-10-CM | POA: Diagnosis not present

## 2015-04-01 DIAGNOSIS — I10 Essential (primary) hypertension: Secondary | ICD-10-CM | POA: Diagnosis not present

## 2015-04-04 DIAGNOSIS — E785 Hyperlipidemia, unspecified: Secondary | ICD-10-CM | POA: Diagnosis not present

## 2015-04-04 DIAGNOSIS — I10 Essential (primary) hypertension: Secondary | ICD-10-CM | POA: Diagnosis not present

## 2015-04-04 DIAGNOSIS — E039 Hypothyroidism, unspecified: Secondary | ICD-10-CM | POA: Diagnosis not present

## 2015-04-04 DIAGNOSIS — R7301 Impaired fasting glucose: Secondary | ICD-10-CM | POA: Diagnosis not present

## 2015-04-07 DIAGNOSIS — R7301 Impaired fasting glucose: Secondary | ICD-10-CM | POA: Diagnosis not present

## 2015-04-07 DIAGNOSIS — I10 Essential (primary) hypertension: Secondary | ICD-10-CM | POA: Diagnosis not present

## 2015-04-07 DIAGNOSIS — E785 Hyperlipidemia, unspecified: Secondary | ICD-10-CM | POA: Diagnosis not present

## 2015-04-07 DIAGNOSIS — E039 Hypothyroidism, unspecified: Secondary | ICD-10-CM | POA: Diagnosis not present

## 2015-04-22 DIAGNOSIS — I10 Essential (primary) hypertension: Secondary | ICD-10-CM | POA: Diagnosis not present

## 2015-08-28 DIAGNOSIS — D485 Neoplasm of uncertain behavior of skin: Secondary | ICD-10-CM | POA: Diagnosis not present

## 2015-08-28 DIAGNOSIS — D225 Melanocytic nevi of trunk: Secondary | ICD-10-CM | POA: Diagnosis not present

## 2015-08-28 DIAGNOSIS — L820 Inflamed seborrheic keratosis: Secondary | ICD-10-CM | POA: Diagnosis not present

## 2015-08-28 DIAGNOSIS — Z808 Family history of malignant neoplasm of other organs or systems: Secondary | ICD-10-CM | POA: Diagnosis not present

## 2015-08-28 DIAGNOSIS — Z23 Encounter for immunization: Secondary | ICD-10-CM | POA: Diagnosis not present

## 2015-08-28 DIAGNOSIS — L821 Other seborrheic keratosis: Secondary | ICD-10-CM | POA: Diagnosis not present

## 2015-08-28 DIAGNOSIS — Z85828 Personal history of other malignant neoplasm of skin: Secondary | ICD-10-CM | POA: Diagnosis not present

## 2015-10-02 DIAGNOSIS — E039 Hypothyroidism, unspecified: Secondary | ICD-10-CM | POA: Diagnosis not present

## 2015-10-02 DIAGNOSIS — I10 Essential (primary) hypertension: Secondary | ICD-10-CM | POA: Diagnosis not present

## 2015-10-02 DIAGNOSIS — R7301 Impaired fasting glucose: Secondary | ICD-10-CM | POA: Diagnosis not present

## 2015-10-02 DIAGNOSIS — E782 Mixed hyperlipidemia: Secondary | ICD-10-CM | POA: Diagnosis not present

## 2015-10-07 DIAGNOSIS — R7301 Impaired fasting glucose: Secondary | ICD-10-CM | POA: Diagnosis not present

## 2015-10-07 DIAGNOSIS — D696 Thrombocytopenia, unspecified: Secondary | ICD-10-CM | POA: Diagnosis not present

## 2015-10-07 DIAGNOSIS — D691 Qualitative platelet defects: Secondary | ICD-10-CM | POA: Diagnosis not present

## 2015-10-07 DIAGNOSIS — E782 Mixed hyperlipidemia: Secondary | ICD-10-CM | POA: Diagnosis not present

## 2015-10-07 DIAGNOSIS — D473 Essential (hemorrhagic) thrombocythemia: Secondary | ICD-10-CM | POA: Diagnosis not present

## 2015-10-07 DIAGNOSIS — I10 Essential (primary) hypertension: Secondary | ICD-10-CM | POA: Diagnosis not present

## 2015-10-07 DIAGNOSIS — E039 Hypothyroidism, unspecified: Secondary | ICD-10-CM | POA: Diagnosis not present

## 2015-10-07 DIAGNOSIS — G459 Transient cerebral ischemic attack, unspecified: Secondary | ICD-10-CM | POA: Diagnosis not present

## 2015-10-16 DIAGNOSIS — R079 Chest pain, unspecified: Secondary | ICD-10-CM | POA: Diagnosis not present

## 2015-10-16 DIAGNOSIS — I10 Essential (primary) hypertension: Secondary | ICD-10-CM | POA: Diagnosis not present

## 2015-10-16 DIAGNOSIS — R42 Dizziness and giddiness: Secondary | ICD-10-CM | POA: Diagnosis not present

## 2015-11-05 DIAGNOSIS — I10 Essential (primary) hypertension: Secondary | ICD-10-CM | POA: Diagnosis not present

## 2015-11-05 DIAGNOSIS — R7301 Impaired fasting glucose: Secondary | ICD-10-CM | POA: Diagnosis not present

## 2015-11-05 DIAGNOSIS — E039 Hypothyroidism, unspecified: Secondary | ICD-10-CM | POA: Diagnosis not present

## 2015-11-11 DIAGNOSIS — G459 Transient cerebral ischemic attack, unspecified: Secondary | ICD-10-CM | POA: Diagnosis not present

## 2015-11-11 DIAGNOSIS — R42 Dizziness and giddiness: Secondary | ICD-10-CM | POA: Diagnosis not present

## 2015-11-11 DIAGNOSIS — I10 Essential (primary) hypertension: Secondary | ICD-10-CM | POA: Diagnosis not present

## 2015-11-11 DIAGNOSIS — Z789 Other specified health status: Secondary | ICD-10-CM | POA: Diagnosis not present

## 2015-11-12 ENCOUNTER — Other Ambulatory Visit: Payer: Self-pay | Admitting: Neurology

## 2015-11-13 ENCOUNTER — Encounter: Payer: Self-pay | Admitting: Neurology

## 2015-11-13 ENCOUNTER — Ambulatory Visit (INDEPENDENT_AMBULATORY_CARE_PROVIDER_SITE_OTHER): Payer: Medicare Other | Admitting: Neurology

## 2015-11-13 VITALS — BP 140/85 | HR 83 | Ht 61.0 in | Wt 127.8 lb

## 2015-11-13 DIAGNOSIS — R2689 Other abnormalities of gait and mobility: Secondary | ICD-10-CM | POA: Diagnosis not present

## 2015-11-13 DIAGNOSIS — E039 Hypothyroidism, unspecified: Secondary | ICD-10-CM

## 2015-11-13 DIAGNOSIS — D473 Essential (hemorrhagic) thrombocythemia: Secondary | ICD-10-CM

## 2015-11-13 DIAGNOSIS — I10 Essential (primary) hypertension: Secondary | ICD-10-CM | POA: Diagnosis not present

## 2015-11-13 DIAGNOSIS — R42 Dizziness and giddiness: Secondary | ICD-10-CM

## 2015-11-13 DIAGNOSIS — E785 Hyperlipidemia, unspecified: Secondary | ICD-10-CM

## 2015-11-13 NOTE — Patient Instructions (Addendum)
-   will do MRI brain and MRA head for further evaluation - will repeat carotid doppler to evaluate neck vessels - will do TCD emboli monitoring due to high platelet count - will do sleep study to rule out sleep apnea - continue ASA for stroke prevention - will consider injection medication for cholesterol if LDL is still higher than 100 - check BP at home and record - Follow up with your primary care physician for stroke risk factor modification. Recommend maintain blood pressure goal 120-140/80, diabetes with hemoglobin A1c goal below 6.5% and lipids with LDL cholesterol goal below 100 mg/dL.  - PT therapy for balance  - follow up in 1-2 months.

## 2015-11-14 DIAGNOSIS — R519 Headache, unspecified: Secondary | ICD-10-CM | POA: Insufficient documentation

## 2015-11-14 DIAGNOSIS — R42 Dizziness and giddiness: Secondary | ICD-10-CM | POA: Diagnosis not present

## 2015-11-14 DIAGNOSIS — R51 Headache: Secondary | ICD-10-CM

## 2015-11-14 DIAGNOSIS — I10 Essential (primary) hypertension: Secondary | ICD-10-CM | POA: Insufficient documentation

## 2015-11-14 DIAGNOSIS — E039 Hypothyroidism, unspecified: Secondary | ICD-10-CM | POA: Insufficient documentation

## 2015-11-14 DIAGNOSIS — E785 Hyperlipidemia, unspecified: Secondary | ICD-10-CM | POA: Insufficient documentation

## 2015-11-14 DIAGNOSIS — D473 Essential (hemorrhagic) thrombocythemia: Secondary | ICD-10-CM | POA: Insufficient documentation

## 2015-11-14 DIAGNOSIS — R2689 Other abnormalities of gait and mobility: Secondary | ICD-10-CM | POA: Insufficient documentation

## 2015-11-14 MED ORDER — ALPRAZOLAM 0.5 MG PO TABS: 0.5000 mg | ORAL_TABLET | Freq: Once | ORAL | Status: DC | PRN

## 2015-11-14 NOTE — Progress Notes (Signed)
NEUROLOGY CLINIC NEW PATIENT NOTE  NAME: Stacie Garza DOB: 1928/05/27 REFERRING PHYSICIAN: Anders Simmonds, MD  I saw Stacie Garza as a new consult in the neurovascular clinic today regarding  Chief Complaint  Patient presents with  . Referral    from Dr. Almira Coaster MD, for dizzy spells,  unstable on feet, and bears to the left alot when ambulating, staggering,headaches occasionally,  .  HPI: Stacie Garza is a 82 y.o. female with PMH of HTN, hypothyroidism, HLD who presents as a new patient for dizziness.   Pt stated that started in 2015, she occasional has lightheadedness on getting up from chair or bed. In 01/2014, she had episode of chest pain during reading, nausea but no vomiting, went to bed, and felt good the second day. Went to see Dr. Hamilton Capri and extensive cardiac work up all negative. Since then, she started to have intermittent lightheadedness on walking, motion related, felt foggy headed and staggering, could lasting a whole day long. Initially, once every a couple of months, but since 05/2015, it increased to once every month. Since this year, it is more frequent, and now became all the time whenever she starts to walk, unstable with gait and like to hold onto things, fear of falling, however, she never had fall and never used any cane or walker, denies HA. She never felt lightheadedness when she is sitting or lying. She also complains of CP on wakening up in the middle of night, once every 2 weeks since this year. She had follow up with Dr. Hamilton Capri and also did 24 hour Holter monitoring was negative. She was referred to neurology for further evaluation.  She has hx of HTN and on azilsartan, BP today 140/85. HLD but not tolerating statins, LDL 157 in 08/2013. Hypothyroidism on synthroid. She also has essential thrombocythemia followed with Dr. Tressie Stalker and JAK2 was positive. She is on ASA, last platelet level 600 in 09/2015. She denies hx of stroke. Denies any  depression or anxiety.  She has CUS in 2015 which was unremarkable. Denies smoking or alcohol. Mom died of old age, and dad died of heart disease at age of 9, sister died of cancer.   Past Medical History  Diagnosis Date  . Hypertension   . Hypothyroidism   . Dizzy    Past Surgical History  Procedure Laterality Date  . Hernia repair     History reviewed. No pertinent family history. Current Outpatient Prescriptions  Medication Sig Dispense Refill  . aspirin 325 MG tablet Take by mouth.    . Azilsartan Medoxomil 40 MG TABS Take by mouth.    . diphenhydrAMINE (BENADRYL) 25 MG tablet Take by mouth every 6 (six) hours as needed.     . folic acid (FOLVITE) A999333 MCG tablet Take by mouth.    . levothyroxine (SYNTHROID, LEVOTHROID) 175 MCG tablet Take by mouth.    . multivitamin-lutein (OCUVITE-LUTEIN) CAPS capsule Take by mouth.    [name] Glycol-Propyl Glycol 0.4-0.3 % SOLN     . vitamin B-12 (CYANOCOBALAMIN) 1000 MCG tablet Take by mouth.    . vitamin C (ASCORBIC ACID) 500 MG tablet Take by mouth.    . zinc gluconate 50 MG tablet Take by mouth.    . ALPRAZolam (XANAX) 0.5 MG tablet Take 1 tablet (0.5 mg total) by mouth once as needed for anxiety (MRI test). 2 tablet 0   No current facility-administered medications for this visit.   Allergies  Allergen Reactions  . Flaxseed (  Linseed) Itching, Other (See Comments) and Rash    Blood in stool  . Chlorthalidone Other (See Comments)    Dry eye (Eyelid stuck to eyeball)  . Lavender Oil Other (See Comments)    Head felt dizzy and foggy, left arm pain, fatigue, off balance  . Metoprolol Other (See Comments)    Headache,  Insomnia, Unstable gait, Lethargy, intermittent SOB.  (Last dose 07/24/2014)  . Monascus Purpureus [employer] Other (See Comments) and Nausea Only    Off balance, head felt heavy, aching in shoulders, fatigue, tightness in chest  . Statins Other (See Comments)    Pain in back of head and neck, UTI, back pain  .  Typhoid Vaccines Other (See Comments)    redness  . Atorvastatin   . Bio-Flax   . Conjugated Estrogens   . Ezetimibe   . Simvastatin    Social History   Social History  . Marital Status: Divorced    Spouse Name: N/A  . Number of Children: N/A  . Years of Education: N/A   Occupational History  . Not on file.   Social History Main Topics  . Smoking status: Never Smoker   . Smokeless tobacco: Not on file  . Alcohol Use: No  . Drug Use: Not on file  . Sexual Activity: Not on file   Other Topics Concern  . Not on file   Social History Narrative  . No narrative on file    Review of Systems Full 14 system review of systems performed and notable only for those listed, all others are neg:  Constitutional:  Weight loss, fatigue Cardiovascular: chest pain Ear/Nose/Throat:  Hearing loss Skin:  Eyes:   Respiratory:  SOB Gastroitestinal:   Genitourinary:  Hematology/Lymphatic:   Endocrine:  Musculoskeletal:   Allergy/Immunology:   Neurological:  Memory loss, HA, weakness, dizziness Psychiatric: decreased energy Sleep: snoring   Physical Exam  Filed Vitals:   11/13/15 1150  BP: 140/85  Pulse: 83    General - Well nourished, well developed, in no apparent distress.  Ophthalmologic - Sharp disc margins OU.  Cardiovascular - Regular rate and rhythm with no murmur.   Neck - supple, no nuchal rigidity .  Mental Status -  Level of arousal and orientation to time, place, and person were intact. Language including expression, naming, repetition, comprehension, reading, and writing was assessed and found intact. Attention span and concentration were normal. Recent and remote memory were intact, 3/3 registration and 3/3 delayed recall Fund of Knowledge was assessed and was intact.  Cranial Nerves II - XII - II - Visual field intact OU. III, IV, VI - Extraocular movements intact. V - Facial sensation intact bilaterally. VII - Facial movement intact  bilaterally. VIII - Hearing & vestibular intact bilaterally. X - Palate elevates symmetrically. XI - Chin turning & shoulder shrug intact bilaterally. XII - Tongue protrusion intact.  Motor Strength - The patient's strength was normal in all extremities and pronator drift was absent.  Bulk was normal and fasciculations were absent.   Motor Tone - Muscle tone was assessed at the neck and appendages and was normal.  Reflexes - The patient's reflexes were normal in all extremities and she had no pathological reflexes.  Sensory - Light touch, temperature/pinprick, vibration and proprioception were assessed and were normal.  Romberg test showed teetering but able to correct herself, no fall.   Coordination - The patient had normal movements in the hands and feet with no ataxia or dysmetria.  Tremor was  absent.  Gait and Station - on walking, very cautious gait, no arm swing, body tense, prefer to hold wall or bed. However, when encounter completed, pt walks out of the office and walks in the hallway with completely normal gait and able to maneuver avoid objects in the hallway without difficulty.   Imaging 06/11/14 MRI brain - Mild chronic small vessel ischemic changes. Otherwise normal. 06/11/14 CUS - 1. No hemodynamically significant stenosis on either side 2. Both vertebral arteries are patent with antegrade flow.  Lab Review 10/07/15 platelet 600 05/28/14 ANA neg, B12 1198 (on B12 1033mcg daily), RPR neg JAK2 V617F, Rfx CALR/E12/MPL5     Assessment and Plan:   In summary, Stacie Garza is a 82 y.o. female with PMH of HTN, hypothyroidism, HLD, thrombocytosis (possible essential thrombocythemia) who presents as a new patient for chronic lightheadedness. Initially intermittent but gradually increased frequency and now all the time, but only happens with walking and motion related. Exam showed more functional component. Her condition more likely chronic subjective dizziness, but due to risk  factors of HTN, HLD not tolerance to statin, essential thrombocythemia, will rule out stroke first. But will refer to PT for balance training at meantime.   - will do MRI brain and MRA head for further evaluation - will repeat carotid doppler - will do TCD emboli monitoring due to essential thrombocythemia - will do sleep study to rule out sleep apnea - continue ASA for stroke prevention - will consider injection medication for cholesterol if LDL is still higher than 100 - check BP at home and record - Follow up with your primary care physician for stroke risk factor modification. Recommend maintain blood pressure goal 120-140/80, diabetes with hemoglobin A1c goal below 6.5% and lipids with LDL cholesterol goal below 100 mg/dL.  - PT therapy for balance  - follow up in 1-2 months.  A total of 45 minutes was spent face-to-face with this patient. Over half this time was spent on counseling patient on the dizziness diagnosis and different diagnostic and therapeutic options available.   Thank you very much for the opportunity to participate in the care of this patient.  Please do not hesitate to call if any questions or concerns arise.  Orders Placed This Encounter  Procedures  . MR Brain Wo Contrast    Standing Status: Future     Number of Occurrences:      Standing Expiration Date: 01/15/2017    Order Specific Question:  Reason for Exam (SYMPTOM  OR DIAGNOSIS REQUIRED)    Answer:  stroke    Order Specific Question:  Preferred imaging location?    Answer:  Internal    Order Specific Question:  Does the patient have a pacemaker or implanted devices?    Answer:  No    Order Specific Question:  What is the patient's sedation requirement?    Answer:  No Sedation  . US Carotid Bilateral    Standing Status: Future     Number of Occurrences:      Standing Expiration Date: 01/15/2017    Order Specific Question:  Reason for Exam (SYMPTOM  OR DIAGNOSIS REQUIRED)    Answer:  stroke    Order  Specific Question:  Preferred imaging location?    Answer:  Internal  . [place] TCD WITHMONITORING    Standing Status: Future     Number of Occurrences:      Standing Expiration Date: 05/16/2016    Order Specific Question:  Reason for Exam (SYMPTOM  OR DIAGNOSIS REQUIRED)    Answer:  stroke    Order Specific Question:  Preferred imaging location?    Answer:  Internal  . Ambulatory referral to Sleep Studies    Referral Priority:  Routine    Referral Type:  Consultation    Referral Reason:  Specialty Services Required    Number of Visits Requested:  1  . Ambulatory referral to Physical Therapy    Referral Priority:  Routine    Referral Type:  Physical Medicine    Referral Reason:  Specialty Services Required    Requested Specialty:  Physical Therapy    Number of Visits Requested:  1    Meds ordered this encounter  Medications  . zinc gluconate 50 MG tablet    Sig: Take by mouth.  Marland Kitchen aspirin 325 MG tablet    Sig: Take by mouth.  . multivitamin-lutein (OCUVITE-LUTEIN) CAPS capsule    Sig: Take by mouth.  . vitamin C (ASCORBIC ACID) 500 MG tablet    Sig: Take by mouth.  . vitamin B-12 (CYANOCOBALAMIN) 1000 MCG tablet    Sig: Take by mouth.  . folic acid (FOLVITE) A999333 MCG tablet    Sig: Take by mouth.  . Azilsartan Medoxomil 40 MG TABS    Sig: Take by mouth.  . diphenhydrAMINE (BENADRYL) 25 MG tablet    Sig: Take by mouth every 6 (six) hours as needed.   [name] Glycol-Propyl Glycol 0.4-0.3 % SOLN    Sig:   . levothyroxine (SYNTHROID, LEVOTHROID) 175 MCG tablet    Sig: Take by mouth.  . ALPRAZolam (XANAX) 0.5 MG tablet    Sig: Take 1 tablet (0.5 mg total) by mouth once as needed for anxiety (MRI test).    Dispense:  2 tablet    Refill:  0    Patient Instructions  - will do MRI brain and MRA head for further evaluation - will repeat carotid doppler to evaluate neck vessels - will do TCD emboli monitoring due to high platelet count - will do sleep study to rule out sleep  apnea - continue ASA for stroke prevention - will consider injection medication for cholesterol if LDL is still higher than 100 - check BP at home and record - Follow up with your primary care physician for stroke risk factor modification. Recommend maintain blood pressure goal 120-140/80, diabetes with hemoglobin A1c goal below 6.5% and lipids with LDL cholesterol goal below 100 mg/dL.  - PT therapy for balance  - follow up in 1-2 months.     Rosalin Hawking, MD PhD [facility] [address], [place], [place] [address] [phone]

## 2015-11-17 DIAGNOSIS — Z789 Other specified health status: Secondary | ICD-10-CM | POA: Diagnosis not present

## 2015-11-17 DIAGNOSIS — I10 Essential (primary) hypertension: Secondary | ICD-10-CM | POA: Diagnosis not present

## 2015-11-17 DIAGNOSIS — G459 Transient cerebral ischemic attack, unspecified: Secondary | ICD-10-CM | POA: Diagnosis not present

## 2015-11-20 ENCOUNTER — Telehealth: Payer: Self-pay

## 2015-11-20 NOTE — Telephone Encounter (Signed)
LFt vm for patient that Xanax is ready for pick up or fax to pharmacy.

## 2015-11-20 NOTE — Telephone Encounter (Signed)
Katrina talked with pt's daughter at this operator station and advised she had faxed RX for xanax to [facility] twice. She advised the daughter if RX was not there to call her back.

## 2015-11-20 NOTE — Telephone Encounter (Signed)
Xanax for 2 pills no refill fax to [facility] in [place] for mri on 11/26/2015.

## 2015-11-25 ENCOUNTER — Ambulatory Visit (INDEPENDENT_AMBULATORY_CARE_PROVIDER_SITE_OTHER): Payer: Medicare Other | Admitting: Neurology

## 2015-11-25 ENCOUNTER — Encounter: Payer: Self-pay | Admitting: Neurology

## 2015-11-25 VITALS — BP 130/66 | HR 72 | Resp 16 | Ht 61.0 in | Wt 129.0 lb

## 2015-11-25 DIAGNOSIS — R0683 Snoring: Secondary | ICD-10-CM

## 2015-11-25 DIAGNOSIS — G4710 Hypersomnia, unspecified: Secondary | ICD-10-CM | POA: Diagnosis not present

## 2015-11-25 DIAGNOSIS — R2689 Other abnormalities of gait and mobility: Secondary | ICD-10-CM

## 2015-11-25 DIAGNOSIS — R42 Dizziness and giddiness: Secondary | ICD-10-CM | POA: Diagnosis not present

## 2015-11-25 NOTE — Progress Notes (Signed)
Subjective:    Patient ID: Stacie Garza is a 82 y.o. female.  HPI     Star Age, MD, PhD [facility] [address].O. [place], [place] [address]  Dear Cornelius Moras,   I saw your patient, Stacie Garza, upon your kind request in my clinic today for initial consultation of her sleep disturbance, concern for underlying obstructive sleep apnea. The patient is accompanied by her daughter today. As you know, Stacie Garza is an 82 year old right-handed woman with an underlying medical history of hypertension, hypothyroidism, hyperlipidemia with statin intolerance, thrombocytosis, lightheadedness, who reports Intermittent balance problems and dizziness. She feels at times insecure with walking. She snores. She has made snorting like sounds in her sleep according to her daughter. She denies morning headaches or nocturia. She feels that she is getting enough sleep. Bedtime is between 10:30 and 11 and wake up time is usually between 7:30 and 8:30. She feels that she is adequately rested. Daughter feels that she is often fatigued. The patient lives alone. She has 3 sons, and 1 daughter. All children live in close drivable distance, [place], [place], [place] and [place]. She likes to drink coffee, in the wintertime she drinks 6-8 cups of coffee, maybe one Pepsi every other day. In the summertime she likes to drink Pepsi, maybe up to 6 cups of coffee. She drinks one large glass of water per day on average. She has had intermittent chest pain, especially at night, since 2015. She has had palpitations. She denies anxiety. Per daughter, she has had short-term memory problems. Weight has remained stable. Appetite is not great but she does not skip any meals typically. Her son has obstructive sleep apnea and uses a CPAP machine. Her Epworth sleepiness score is 1 out of 24 today, her fatigue score is 21 out of 63 today. I reviewed your office note from 11/13/2015. You ordered additional  testing including MRI brain, Doppler ultrasound of neck arteries and TCD which are pending for next week, MRI scheduled for tomorrow. In addition, she has PT evaluation next week.  Her Past Medical History Is Significant For: Past Medical History  Diagnosis Date  . Hypertension   . Hypothyroidism   . Dizzy     Her Past Surgical History Is Significant For: Past Surgical History  Procedure Laterality Date  . Hernia repair      Her Family History Is Significant For: No family history on file.  Her Social History Is Significant For: Social History   Social History  . Marital Status: Divorced    Spouse Name: N/A  . Number of Children: N/A  . Years of Education: N/A   Occupational History  . Retired    Social History Main Topics  . Smoking status: Never Smoker   . Smokeless tobacco: None  . Alcohol Use: No  . Drug Use: No  . Sexual Activity: Not Asked   Other Topics Concern  . None   Social History Narrative    Her Allergies Are:  Allergies  Allergen Reactions  . Flaxseed (Linseed) Itching, Other (See Comments) and Rash    Blood in stool  . Chlorthalidone Other (See Comments)    Dry eye (Eyelid stuck to eyeball)  . Lavender Oil Other (See Comments)    Head felt dizzy and foggy, left arm pain, fatigue, off balance  . Metoprolol Other (See Comments)    Headache,  Insomnia, Unstable gait, Lethargy, intermittent SOB.  (Last dose 07/24/2014)  . Monascus Purpureus [employer]  Other (See Comments) and Nausea Only    Off balance, head felt heavy, aching in shoulders, fatigue, tightness in chest  . Statins Other (See Comments)    Pain in back of head and neck, UTI, back pain  . Typhoid Vaccines Other (See Comments)    redness  . Atorvastatin   . Bio-Flax   . Conjugated Estrogens   . Ezetimibe   . Simvastatin   :   Her Current Medications Are:  Outpatient Encounter Prescriptions as of 11/25/2015  Medication Sig  . ALPRAZolam (XANAX) 0.5 MG tablet Take 1 tablet (0.5  mg total) by mouth once as needed for anxiety (MRI test).  Marland Kitchen aspirin 325 MG tablet Take by mouth.  . Azilsartan Medoxomil 40 MG TABS Take by mouth.  . diphenhydrAMINE (BENADRYL) 25 MG tablet Take by mouth every 6 (six) hours as needed.   . folic acid (FOLVITE) A999333 MCG tablet Take by mouth.  . levothyroxine (SYNTHROID, LEVOTHROID) 175 MCG tablet Take by mouth.  . multivitamin-lutein (OCUVITE-LUTEIN) CAPS capsule Take by mouth.  [name] Glycol-Propyl Glycol 0.4-0.3 % SOLN   . vitamin B-12 (CYANOCOBALAMIN) 1000 MCG tablet Take by mouth.  . vitamin C (ASCORBIC ACID) 500 MG tablet Take by mouth.  . zinc gluconate 50 MG tablet Take by mouth.   No facility-administered encounter medications on file as of 11/25/2015.  :  Review of Systems:  Out of a complete 14 point review of systems, all are reviewed and negative with the exception of these symptoms as listed below:   Review of Systems  Constitutional: Positive for fatigue.  HENT: Positive for hearing loss.   Respiratory: Positive for shortness of breath.   Cardiovascular: Positive for chest pain.  Musculoskeletal: Positive for gait problem and neck stiffness.  Neurological: Positive for dizziness, weakness and headaches.       Occasional trouble sleeping, weakness, snoring, wakes up in the night with chest pain Daughter reports that patient has disinterest in activities and get fatigue easily.     Epworth Sleepiness Scale 0= would never doze 1= slight chance of dozing 2= moderate chance of dozing 3= high chance of dozing  Sitting and reading:1 Watching TV:0 Sitting inactive in a public place (ex. Theater or meeting):0 As a passenger in a car for an hour without a break:0 Lying down to rest in the afternoon:0 Sitting and talking to someone:0 Sitting quietly after lunch (no alcohol):0 In a car, while stopped in traffic:0 Total:1  Objective:  Neurologic Exam  Physical Exam Physical Examination:   Filed Vitals:   11/25/15  1538  BP: 130/66  Pulse: 72  Resp: 16   General Examination: The patient is a very pleasant 82 y.o. female in no acute distress. She appears well-developed and well-nourished and very well groomed.   HEENT: Normocephalic, atraumatic, pupils are equal, round and reactive to light and accommodation. Funduscopic exam is normal with sharp disc margins noted. Extraocular tracking is good without limitation to gaze excursion or nystagmus noted. Normal smooth pursuit is noted. Hearing is grossly intact. Tympanic membranes are clear bilaterally. Face is symmetric with normal facial animation and normal facial sensation. Speech is clear with no dysarthria noted. There is no hypophonia. There is no lip, neck/head, jaw or voice tremor. Neck is supple with full range of passive and active motion. There are no carotid bruits on auscultation. Oropharynx exam reveals: mild mouth dryness, adequate dental hygiene and mild airway crowding, due to smaller airway entry and redundant soft palate, tonsils are small,  right more visible than left. Mallampati is class II. Tongue protrudes centrally and palate elevates symmetrically. Neck size is 13 1/8 inches.   Chest: Clear to auscultation without wheezing, rhonchi or crackles noted.  Heart: S1+S2+0, regular and normal without murmurs, rubs or gallops noted.   Abdomen: Soft, non-tender and non-distended with normal bowel sounds appreciated on auscultation.  Extremities: There is no pitting edema in the distal lower extremities bilaterally. Pedal pulses are intact.  Skin: Warm and dry without trophic changes noted.   Musculoskeletal: exam reveals no obvious joint deformities, tenderness or joint swelling or erythema.   Neurologically:  Mental status: The patient is awake, alert and oriented in all 4 spheres. Her immediate and remote memory, attention, language skills and fund of knowledge are appropriate. There is no evidence of aphasia, agnosia, apraxia or anomia.  Speech is clear with normal prosody and enunciation. Thought process is linear. Mood is normal and affect is normal.  Cranial nerves II - XII are as described above under HEENT exam. In addition: shoulder shrug is normal with equal shoulder height noted. Motor exam: Normal bulk, strength and tone is noted. There is no drift, tremor or rebound. Romberg is negative, with the exception of mild sway. Reflexes are 2+ throughout. Babinski: Toes are flexor bilaterally. Fine motor skills and coordination: intact with normal finger taps, normal hand movements, normal rapid alternating patting, normal foot taps and normal foot agility.  Cerebellar testing: No dysmetria or intention tremor on finger to nose testing. Heel to shin is unremarkable bilaterally. There is no truncal or gait ataxia.  Sensory exam: intact in the upper and lower extremities.  Gait, station and balance: She stands with difficulty. No veering to one side is noted. No leaning to one side is noted. Posture is age-appropriate and stance is slightly wider base. She walks slightly cautiously but no festination is noted, no shuffling is noted, preserved arm swing bilaterally. She turns slightly slowly.  Assessment and Plan:  In summary, Stacie Garza is a very pleasant 82 y.o.-year old female with an underlying medical history of hypertension, hypothyroidism, hyperlipidemia with statin intolerance, thrombocytosis, lightheadedness, whose history and physical exam are not tell-tale for obstructive sleep apnea, but she does reports snoring, is reported to have some snorting/abnormal sounds in sleep, reports a FHx of OSA in her son.  I had a long chat with the patient and her daughter about my findings and the diagnosis of OSA, its prognosis and treatment options. We talked about medical treatments, surgical interventions and non-pharmacological approaches. I explained in particular the risks and ramifications of untreated moderate to severe OSA,  especially with respect to developing cardiovascular disease down the Road, including congestive heart failure, difficult to treat hypertension, cardiac arrhythmias, or stroke. Even type 2 diabetes has, in part, been linked to untreated OSA. Symptoms of untreated OSA include daytime sleepiness, memory problems, mood irritability and mood disorder such as depression and anxiety, lack of energy, as well as recurrent headaches, especially morning headaches. We talked about trying to maintain a healthy lifestyle in general, as well as the importance of weight control. I encouraged the patient to eat healthy, exercise daily and keep well hydrated, to keep a scheduled bedtime and wake time routine, to not skip any meals and eat healthy snacks in between meals. I advised the patient not to drive when feeling sleepy. She is advised to reduce her coffee and soda intake and increase her water intake. I suggested we proceed with a sleep study. However,  patient would like to hold off at this time as she has other tests pending and would like to see the results of those tests first. I understand this completely and agree. She is advised to call us back when she is ready to proceed with a sleep study. I did talk to him about the sleep test procedure and treatment option of sleep apnea with CPAP. At this juncture, I will see her back as needed, she is encouraged to call us back when she is ready to schedule her sleep study. I answered all the questions today and the patient and her daughter were in agreement. Thank you very much for allowing me to participate in the care of this nice patient. If I can be of any further assistance to you please do not hesitate to talk to me.   Sincerely,   Star Age, MD, PhD

## 2015-11-25 NOTE — Patient Instructions (Addendum)
Based on your symptoms and your exam I believe you may be at some risk for obstructive sleep apnea or OSA, and I think we should consider doing a sleep study to determine whether you do or do not have OSA and how severe it is. If you have more than mild OSA, I want you to consider treatment with CPAP. Please remember, the risks and ramifications of moderate to severe obstructive sleep apnea or OSA are: Cardiovascular disease, including congestive heart failure, stroke, difficult to control hypertension, arrhythmias, and even type 2 diabetes has been linked to untreated OSA. Sleep apnea causes disruption of sleep and sleep deprivation in most cases, which, in turn, can cause recurrent headaches, problems with memory, mood, concentration, focus, and vigilance. Most people with untreated sleep apnea report excessive daytime sleepiness, which can affect their ability to drive. Please do not drive if you feel sleepy.   I will likely see you back after your sleep study to go over the test results and where to go from there. We will call you after your sleep study to advise about the results (most likely, you will hear from Beverlee Nims, my nurse) and to set up an appointment at the time, as necessary.    Our sleep lab administrative assistant, Arrie Aran will meet with you or call you to schedule your sleep study. If you don't hear back from her by next week please feel free to call her at [phone]. This is her direct line and please leave a message with your phone number to call back if you get the voicemail box. She will call back as soon as possible.   You can call back when you are ready to schedule your sleep study.   Drink more water, less soda and less coffee.

## 2015-11-26 ENCOUNTER — Ambulatory Visit
Admission: RE | Admit: 2015-11-26 | Discharge: 2015-11-26 | Disposition: A | Discharge status: Home / Self Care | Payer: Medicare Other | Source: Ambulatory Visit | Attending: Neurology | Admitting: Neurology

## 2015-11-26 DIAGNOSIS — R2689 Other abnormalities of gait and mobility: Secondary | ICD-10-CM

## 2015-11-26 DIAGNOSIS — R42 Dizziness and giddiness: Secondary | ICD-10-CM

## 2015-12-04 ENCOUNTER — Ambulatory Visit: Payer: Medicare Other | Attending: Internal Medicine | Admitting: Physical Therapy

## 2015-12-04 ENCOUNTER — Ambulatory Visit (INDEPENDENT_AMBULATORY_CARE_PROVIDER_SITE_OTHER): Payer: Medicare Other

## 2015-12-04 ENCOUNTER — Encounter: Payer: Self-pay | Admitting: Physical Therapy

## 2015-12-04 DIAGNOSIS — R2681 Unsteadiness on feet: Secondary | ICD-10-CM | POA: Diagnosis not present

## 2015-12-04 DIAGNOSIS — R2689 Other abnormalities of gait and mobility: Secondary | ICD-10-CM

## 2015-12-04 DIAGNOSIS — M6281 Muscle weakness (generalized): Secondary | ICD-10-CM | POA: Diagnosis not present

## 2015-12-04 DIAGNOSIS — R42 Dizziness and giddiness: Secondary | ICD-10-CM | POA: Diagnosis not present

## 2015-12-04 NOTE — Therapy (Signed)
[facility] [address] [place], [place], [address] Phone: [phone]   Fax:  [phone]  Physical Therapy Evaluation  Patient Details  Name: Stacie Garza MRN: [id number] Date of Birth: 1928/03/18 Referring Provider: Wenda Low, MD  Encounter Date: 12/04/2015      PT End of Session - 12/04/15 1438    Visit Number 1   Number of Visits 9   Authorization Type Medicare G code in progress report every 10 visits   PT Start Time 1316   PT Stop Time 1407   PT Time Calculation (min) 51 min   Equipment Utilized During Treatment Gait belt   Activity Tolerance Patient tolerated treatment well   Behavior During Therapy [facility] for tasks assessed/performed      Past Medical History  Diagnosis Date  . Hypertension   . Hypothyroidism   . Dizzy     Past Surgical History  Procedure Laterality Date  . Hernia repair      There were no vitals filed for this visit.       Subjective Assessment - 12/04/15 1327    Subjective Lightheadness, foggy with sittng. Unsteady with walking been happening since 2015, gotten worse in the last 2 months.  Checks BP every morning per cardiologist with noted issues. She presents to PT for evaluation with diagnosis of gait abnormality and dizziness & giddiness.    Patient is accompained by: Family member   Pertinent History HTN, hypothyriodism, HLD   Limitations Walking;Sitting   Patient Stated Goals Walk without feeling dizzy, and not be scared to move.    Currently in Pain? No/denies   Multiple Pain Sites No            [facility] PT Assessment - 12/04/15 0001    Assessment   Medical Diagnosis Gait Abnormality, Dizziness & Giddiness   Referring Provider Wenda Low, MD   Onset Date/Surgical Date 11/13/15  MD appt   Hand Dominance Right   Next MD Visit 01/07/2016   Precautions   Precautions Fall   Restrictions   Weight Bearing Restrictions No   Balance Screen   Has the patient fallen  in the past 6 months No   Has the patient had a decrease in activity level because of a fear of falling?  No   Is the patient reluctant to leave their home because of a fear of falling?  No   Home Environment   Living Environment Private residence   Living Arrangements Alone   Type of [place] Access Level entry   Home Layout Multi-level  bottom level is den, Retail buyer, 2nd is kitchen, living room   Alternate Level Stairs-Number of Steps 9    Alternate Level Stairs-Rails Left   Home Equipment None   Prior Function   Level of Independence Independent   Observation/Other Assessments   Focus on Therapeutic Outcomes (FOTO)  NP patient started but did not complete   Posture/Postural Control   Postural Limitations Forward head;Rounded Shoulders;Decreased lumbar lordosis   ROM / Strength   AROM / PROM / Strength Strength   Strength   Strength Assessment Site Hip;Knee;Ankle   Right/Left Hip Right   Right Hip Flexion 4/5   Right Hip Extension 4/5   Right Hip ABduction 4-/5   Right Hip ADduction 4-/5   Left Hip Flexion 4/5   Left Hip Extension 4-/5   Left Hip ABduction 3+/5   Left Hip ADduction 4-/5   Right Knee Flexion 4/5  Right Knee Extension 4/5   Left Knee Flexion 4/5   Left Knee Extension 4-/5   Right Ankle Dorsiflexion 4/5   Left Ankle Dorsiflexion 4/5   Transfers   Transfers Sit to Stand;Stand to Sit   Sit to Stand 5: Supervision  pt reported lightheadness, foggy    Stand to Sit 5: Supervision   Ambulation/Gait   Ambulation/Gait Yes   Ambulation/Gait Assistance 5: Supervision   Ambulation Distance (Feet) 500 Feet   Assistive device None   Gait Pattern Step-through pattern;Wide base of support;Decreased stride length;Decreased arm swing - left   Ambulation Surface Level;Indoor   Gait velocity 3.69 ft/sec   Stairs Yes   Stairs Assistance 5: Supervision   Stairs Assistance Details (indicate cue type and reason) pt uses one hand rail for fear of being off  balance   Stair Management Technique One rail Right   Number of Stairs 4   Height of Stairs 6   Gait Comments pt ambulates with increased sway but no incidence of LOB and reports feeling lightheaded/foggy after ambulation   Standardized Balance Assessment   Standardized Balance Assessment Berg Balance Test;Timed Up and Go Test   Berg Balance Test   Sit to Stand Able to stand without using hands and stabilize independently   Standing Unsupported Able to stand safely 2 minutes   Sitting with Back Unsupported but Feet Supported on Floor or Stool Able to sit safely and securely 2 minutes   Stand to Sit Sits safely with minimal use of hands   Transfers Able to transfer safely, minor use of hands   Standing Unsupported with Eyes Closed Able to stand 10 seconds safely   Standing Ubsupported with Feet Together Able to place feet together independently and stand 1 minute safely   From Standing, Reach Forward with Outstretched Arm Can reach confidently >25 cm (10")   From Standing Position, Pick up Object from Floor Able to pick up shoe safely and easily   From Standing Position, Turn to Look Behind Over each Shoulder Looks behind one side only/other side shows less weight shift   Turn 360 Degrees Able to turn 360 degrees safely but slowly   Standing Unsupported, Alternately Place Feet on Step/Stool Able to complete >2 steps/needs minimal assist   Standing Unsupported, One Foot in Front Able to take small step independently and hold 30 seconds   Standing on One Leg Tries to lift leg/unable to hold 3 seconds but remains standing independently   Total Score 45   Timed Up and Go Test   TUG Normal TUG   Normal TUG (seconds) 7.69   Functional Gait  Assessment   Gait assessed  Yes   Gait Level Surface Walks 20 ft, slow speed, abnormal gait pattern, evidence for imbalance or deviates 10-15 in outside of the 12 in walkway width. Requires more than 7 sec to ambulate 20 ft.   Change in Gait Speed Makes only  minor adjustments to walking speed, or accomplishes a change in speed with significant gait deviations, deviates 10-15 in outside the 12 in walkway width, or changes speed but loses balance but is able to recover and continue walking.   Gait with Horizontal Head Turns Performs head turns with moderate changes in gait velocity, slows down, deviates 10-15 in outside 12 in walkway width but recovers, can continue to walk.   Gait with Vertical Head Turns Performs task with moderate change in gait velocity, slows down, deviates 10-15 in outside 12 in walkway width but recovers,  can continue to walk.   Gait and Pivot Turn Pivot turns safely in greater than 3 sec and stops with no loss of balance, or pivot turns safely within 3 sec and stops with mild imbalance, requires small steps to catch balance.   Step Over Obstacle Is able to step over one shoe box (4.5 in total height) without changing gait speed. No evidence of imbalance.   Gait with Narrow Base of Support Ambulates less than 4 steps heel to toe or cannot perform without assistance.   Gait with Eyes Closed Cannot walk 20 ft without assistance, severe gait deviations or imbalance, deviates greater than 15 in outside 12 in walkway width or will not attempt task.   Ambulating Backwards Walks 20 ft, slow speed, abnormal gait pattern, evidence for imbalance, deviates 10-15 in outside 12 in walkway width.   Steps Alternating feet, must use rail.   Total Score 11            Vestibular Assessment - 12/04/15 0001    Orthostatics   BP supine (x 5 minutes) 135/92 mmHg  no symptoms   HR supine (x 5 minutes) 92   BP sitting 125/77 mmHg  light foggy feeling   HR sitting 86   BP standing (after 1 minute) 124/82 mmHg  feels a little foggy    HR standing (after 1 minute) 96   BP standing (after 3 minutes) 127/96 mmHg  light foggy feeling   HR standing (after 3 minutes) 113                       PT Education - 12/04/15 1437     Education provided Yes   Education Details Pt was educated on PT services, and increasing physical activity level    Person(s) Educated Patient   Methods Explanation   Comprehension Verbalized understanding             PT Long Term Goals - 12/04/15 1420    PT LONG TERM GOAL #1   Title Patient demonstrates understanding of HEP / ongoing fitness program.    Time 4   Period Weeks   Status New   PT LONG TERM GOAL #2   Title Patient reports >50% improvement in dizziness with activities.   Time 4   Period Weeks   Status New   PT LONG TERM GOAL #3   Title Berg Balance >50/56 to indicate lower fall risk.   Time 4   Period Weeks   Status New   PT LONG TERM GOAL #4   Title Functional Gait Assessment >/= 19/30 to indicate lower fall risk.    Time 4   Period Weeks   Status New               Plan - 12/04/15 1737    Clinical Impression Statement Pt is a 82 year old female with a lightheadness/fogginess and imbalance with ambulation. PMH HTN, hypothyriodism, HLD. During eval patient demostrated bilateral lower extremity weakness, more significant on the L, balance defiets. Pt reported becoming lightheaded/foggy with sit to stand and with walking. She was positive for Orthostatic Hypotension with postioning BP testing difference [id number] drop in diastolic pressure from sit to stand and symptomatic (non-verbal indicated higher than verbal report). Patient has high risk of falls indicated by [facility] 45/56 and Functional Gait Assessment 11/30. She has gait abnormalities with scanning & negotiating obstacles. Physical Therapy was recommended but at this time patient wishes to be on hold  from PT until she gets results of further cardiac testing.    Rehab Potential Good   Clinical Impairments Affecting Rehab Potential Acitivty level and family support   PT Frequency 2x / week   PT Duration 4 weeks  pt requesting to be placed on hold until she has platlet test done   PT  Treatment/Interventions Balance training;Therapeutic exercise;Therapeutic activities;Functional mobility training;Stair training;Gait training;Neuromuscular re-education;Patient/family education;Manual techniques;Vestibular;DME Instruction   PT Next Visit Plan Instruct in HEP for balance, Assess vestibular system more   Consulted and Agree with Plan of Care Patient      Patient will benefit from skilled therapeutic intervention in order to improve the following deficits and impairments:  Abnormal gait, Dizziness, Decreased balance, Decreased activity tolerance, Decreased strength, Improper body mechanics, Postural dysfunction, Impaired perceived functional ability, Decreased endurance  Visit Diagnosis: Other abnormalities of gait and mobility  Muscle weakness (generalized)  Unsteadiness on feet  Dizziness and giddiness      G-Codes - 2015/12/10 1745    Functional Assessment Tool Used Berg Balance 45/56 & Functional Gait Assessment 11/30   Functional Limitation Mobility: Walking and moving around   Mobility: Walking and Moving Around Current Status [phone]) At least 60 percent but less than 80 percent impaired, limited or restricted   Mobility: Walking and Moving Around Goal Status [phone]) At least 20 percent but less than 40 percent impaired, limited or restricted       Problem List Patient Active Problem List   Diagnosis Date Noted  . Dizziness 11/14/2015  . Headache 11/14/2015  . Imbalance 11/14/2015  . Essential hypertension 11/14/2015  . HLD (hyperlipidemia) 11/14/2015  . Essential thrombocythemia ([place]) 11/14/2015  . Thyroid activity decreased 11/14/2015  . HYPOTHYROIDISM 04/05/2009  . HYPERCHOLESTEROLEMIA 04/05/2009  . HYPERTENSION, UNSPECIFIED 04/05/2009  . PALPITATIONS, HX OF 04/05/2009   Dillard Essex, SPT  10-Dec-2015, 5:46 PM   Jamey Reas, PT, DPT  12/05/2015, 6:31 AM  [facility] [address] [place], [place], [address] Phone: [phone]   Fax:  [phone]  Name: Stacie Garza MRN: [id number] Date of Birth: [birth year]

## 2015-12-05 ENCOUNTER — Ambulatory Visit: Payer: PRIVATE HEALTH INSURANCE | Admitting: Neurology

## 2015-12-09 DIAGNOSIS — E039 Hypothyroidism, unspecified: Secondary | ICD-10-CM | POA: Diagnosis not present

## 2015-12-17 ENCOUNTER — Telehealth: Payer: Self-pay

## 2015-12-17 NOTE — Telephone Encounter (Signed)
Rn gave patient results of her TCD test. PT stated she is still dizzy. Rn stated a referral was put in for next [facility] outpatient rehab. Rn stated they have call and left messages for her to call back. Pt stated she never got a message. Pt states she lives in [place] and would like it at [facility]. There was also a referral to sleep study.Message was sent to sleep staff.

## 2015-12-18 ENCOUNTER — Telehealth: Payer: Self-pay | Admitting: Neurology

## 2015-12-18 NOTE — Telephone Encounter (Signed)
I spoke to patient and went over Dr. Guadelupe Sabin recommendations and reasoning for sleep study. Patient stated that she did not want to pursue sleep study at this time.

## 2015-12-18 NOTE — Telephone Encounter (Signed)
I received a note from [facility] about the patient wanting to be scheduled for her sleep test.  I didn't have an order for this patient but I spoke with Beverlee Nims and decided to call her and see if I could get her schedule for the sleep test.  There is not an order in at this time however I was going to schedule it out for a date past Dr. Guadelupe Sabin return.  The patient didn't want to schedule a sleep test because she sleeps good.  She asked was she missing something about understanding this test.  I did tell her that you could sleep all night and still have sleep apnea.  I think the patient is confused.

## 2015-12-18 NOTE — Telephone Encounter (Signed)
Rn call patient to give her [facility] outpatient rehab located in [place]. Rn stated the orders were sent there. Pt will call to schedule the appt for her dizzy spells.

## 2015-12-29 NOTE — Telephone Encounter (Signed)
Patient was given number for [facility] to call to schedule appt.

## 2016-01-07 ENCOUNTER — Ambulatory Visit (INDEPENDENT_AMBULATORY_CARE_PROVIDER_SITE_OTHER): Payer: Medicare Other | Admitting: Neurology

## 2016-01-07 ENCOUNTER — Encounter: Payer: Self-pay | Admitting: Neurology

## 2016-01-07 VITALS — BP 149/84 | HR 78 | Ht 61.0 in | Wt 132.2 lb

## 2016-01-07 DIAGNOSIS — R2689 Other abnormalities of gait and mobility: Secondary | ICD-10-CM

## 2016-01-07 DIAGNOSIS — R42 Dizziness and giddiness: Secondary | ICD-10-CM

## 2016-01-07 DIAGNOSIS — D473 Essential (hemorrhagic) thrombocythemia: Secondary | ICD-10-CM

## 2016-01-07 DIAGNOSIS — E785 Hyperlipidemia, unspecified: Secondary | ICD-10-CM | POA: Diagnosis not present

## 2016-01-07 NOTE — Patient Instructions (Signed)
-   continue ASA for stroke prevention - check BP at home and record - Follow up with your primary care physician for stroke risk factor modification. Recommend maintain blood pressure goal 120-140/80, diabetes with hemoglobin A1c goal below 6.5% and lipids with LDL cholesterol goal below 100 mg/dL. - refer to hematology and cardiology for continued follow up  - reorder PT therapy for balance and gait to [facility] - follow up in 4 months.

## 2016-01-07 NOTE — Progress Notes (Signed)
NEUROLOGY CLINIC FOLLOW UP NOTE  NAME: Stacie Garza DOB: July 30, 1927  HPI: Stacie Garza is a 82 y.o. female with PMH of HTN, hypothyroidism, HLD who presents as a new patient for dizziness.   Pt stated that started in 2015, she occasional has lightheadedness on getting up from chair or bed. In 01/2014, she had episode of chest pain during reading, nausea but no vomiting, went to bed, and felt good the second day. Went to see Dr. Hamilton Capri and extensive cardiac work up all negative. Since then, she started to have intermittent lightheadedness on walking, motion related, felt foggy headed and staggering, could lasting a whole day long. Initially, once every a couple of months, but since 05/2015, it increased to once every month. Since this year, it is more frequent, and now became all the time whenever she starts to walk, unstable with gait and like to hold onto things, fear of falling, however, she never had fall and never used any cane or walker, denies HA. She never felt lightheadedness when she is sitting or lying. She also complains of CP on wakening up in the middle of night, once every 2 weeks since this year. She had follow up with Dr. Hamilton Capri and also did 24 hour Holter monitoring was negative. She was referred to neurology for further evaluation.  She has hx of HTN and on azilsartan, BP today 140/85. HLD but not tolerating statins, LDL 157 in 08/2013. Hypothyroidism on synthroid. She also has essential thrombocythemia followed with Dr. Tressie Stalker and JAK2 was positive. She is on ASA, last platelet level 600 in 09/2015. She denies hx of stroke. Denies any depression or anxiety.  She has CUS in 2015 which was unremarkable. Denies smoking or alcohol. Mom died of old age, and dad died of heart disease at age of 19, sister died of cancer.   Interval history: During the interval time, pt has been doing well. Still complain some gait imbalance, stumbles but less severe and had no fall. She has been  working on the yard planting trees and flowers, and has declined PT/OT due to her busy schedule. She also declined sleep study as she believes she does not have OSA. She had MRI brain which showed no acute infarct, but punctate cerebellar lacunes, apparently asymptomatic. She had CUS, and TCD MES were both negative. She BP at home between 110-130, although in clinic today 149/84. She is on ASA but not statin due to allergy.    Past Medical History  Diagnosis Date  . Hypertension   . Hypothyroidism   . Dizzy    Past Surgical History  Procedure Laterality Date  . Hernia repair     History reviewed. No pertinent family history. Current Outpatient Prescriptions  Medication Sig Dispense Refill  . aspirin 325 MG tablet Take by mouth.    . Azilsartan Medoxomil 40 MG TABS Take by mouth.    . diphenhydrAMINE (BENADRYL) 25 MG tablet Take by mouth every 6 (six) hours as needed.     . folic acid (FOLVITE) A999333 MCG tablet Take by mouth.    . levothyroxine (SYNTHROID, LEVOTHROID) 175 MCG tablet Take by mouth.    . multivitamin-lutein (OCUVITE-LUTEIN) CAPS capsule Take by mouth.    [name] Glycol-Propyl Glycol 0.4-0.3 % SOLN     . UNABLE TO FIND Med Name: Garlic take one pill daily    . vitamin B-12 (CYANOCOBALAMIN) 1000 MCG tablet Take by mouth.    . vitamin C (ASCORBIC ACID) 500 MG tablet  Take by mouth.    . zinc gluconate 50 MG tablet Take by mouth.     No current facility-administered medications for this visit.   Allergies  Allergen Reactions  . Flaxseed (Linseed) Itching, Other (See Comments) and Rash    Blood in stool  . Chlorthalidone Other (See Comments)    Dry eye (Eyelid stuck to eyeball)  . Lavender Oil Other (See Comments)    Head felt dizzy and foggy, left arm pain, fatigue, off balance  . Metoprolol Other (See Comments)    Headache,  Insomnia, Unstable gait, Lethargy, intermittent SOB.  (Last dose 07/24/2014)  . Monascus Purpureus [employer] Other (See Comments) and Nausea  Only    Off balance, head felt heavy, aching in shoulders, fatigue, tightness in chest  . Statins Other (See Comments)    Pain in back of head and neck, UTI, back pain  . Typhoid Vaccines Other (See Comments)    redness  . Atorvastatin   . Bio-Flax   . Conjugated Estrogens   . Ezetimibe   . Simvastatin    Social History   Social History  . Marital Status: Divorced    Spouse Name: N/A  . Number of Children: N/A  . Years of Education: N/A   Occupational History  . Retired    Social History Main Topics  . Smoking status: Never Smoker   . Smokeless tobacco: Not on file  . Alcohol Use: No  . Drug Use: No  . Sexual Activity: Not on file   Other Topics Concern  . Not on file   Social History Narrative    Review of Systems Full 14 system review of systems performed and notable only for those listed, all others are neg:  Constitutional:   Cardiovascular:  Ear/Nose/Throat:   Skin:  Eyes:   Respiratory:  SOB at times Gastroitestinal:   Genitourinary:  Hematology/Lymphatic:   Endocrine:  Musculoskeletal:   Allergy/Immunology:   Neurological:  weakness, dizziness Psychiatric:  Sleep: snoring   Physical Exam  Filed Vitals:   01/07/16 1442  BP: 149/84  Pulse: 78    General - Well nourished, well developed, in no apparent distress.  Ophthalmologic - Sharp disc margins OU.  Cardiovascular - Regular rate and rhythm with no murmur.   Neck - supple, no nuchal rigidity .  Mental Status -  Level of arousal and orientation to time, place, and person were intact. Language including expression, naming, repetition, comprehension, reading, and writing was assessed and found intact. Attention span and concentration were normal. Recent and remote memory were intact, 3/3 registration and 3/3 delayed recall Fund of Knowledge was assessed and was intact.  Cranial Nerves II - XII - II - Visual field intact OU. III, IV, VI - Extraocular movements intact. V - Facial  sensation intact bilaterally. VII - Facial movement intact bilaterally. VIII - Hearing & vestibular intact bilaterally. X - Palate elevates symmetrically. XI - Chin turning & shoulder shrug intact bilaterally. XII - Tongue protrusion intact.  Motor Strength - The patient's strength was normal in all extremities and pronator drift was absent.  Bulk was normal and fasciculations were absent.   Motor Tone - Muscle tone was assessed at the neck and appendages and was normal.  Reflexes - The patient's reflexes were normal in all extremities and she had no pathological reflexes.  Sensory - Light touch, temperature/pinprick were assessed and were normal.  Romberg test showed teetering but able to correct herself, no fall.   Coordination - The  patient had normal movements in the hands and feet with no ataxia or dysmetria.  Tremor was absent.  Gait and Station - normal gait, station, only with mild unsteady on turning.   Imaging 06/11/14 MRI brain - Mild chronic small vessel ischemic changes. Otherwise normal.  06/11/14 CUS - 1. No hemodynamically significant stenosis on either side 2. Both vertebral arteries are patent with antegrade flow.  MRI 11/26/15 1. Moderate periventricular and subcortical and pontine chronic small vessel ischemic disease. 2. Bilateral cerebellar chronic lacunar infarcts. 3. No acute findings.  CUS 12/04/15- right ICA 50-69% stenosis  TCD emboli detection 12/04/15 - no MES  Lab Review 10/07/15 platelet 600, LDL 127  05/28/14 ANA neg, B12 1198 (on B12 1065mcg daily), RPR neg, A1C 5.9  JAK2 V617F, Rfx CALR/E12/MPL5     Assessment:   In summary, Stacie Garza is a 82 y.o. female with PMH of HTN, hypothyroidism, HLD, thrombocytosis (possible essential thrombocythemia) who presents as a new patient for chronic lightheadedness. Initially intermittent but gradually increased frequency and now all the time, but only happens with walking and motion related. Exam showed more  functional component. Her condition more likely chronic subjective dizziness, but due to risk factors of HTN, HLD not tolerance to statin, essential thrombocythemia, need to rule out stroke first. MRI no acute infarct but punctate cerebellar lacunes. CUS right ICA 50-69% stenosis and TCD MES negative. Will refer to hematology for essential thrombocythemia, and PT for balance training.  Plan: - continue ASA for stroke prevention - check BP at home and record - Follow up with your primary care physician for stroke risk factor modification. Recommend maintain blood pressure goal 120-140/80, diabetes with hemoglobin A1c goal below 6.5% and lipids with LDL cholesterol goal below 100 mg/dL. - refer to hematology and cardiology for continued follow up  - reorder PT therapy for balance and gait to [facility] - follow up in 4 months.  I spent more than 25 minutes of face to face time with the patient. Greater than 50% of time was spent in counseling and coordination of care.    Orders Placed This Encounter  Procedures  . Ambulatory referral to Physical Therapy    Referral Priority:  Routine    Referral Type:  Physical Medicine    Referral Reason:  Specialty Services Required    Requested Specialty:  Physical Therapy    Number of Visits Requested:  1  . Ambulatory referral to Occupational Therapy    Referral Priority:  Routine    Referral Type:  Occupational Therapy    Referral Reason:  Specialty Services Required    Requested Specialty:  Occupational Therapy    Number of Visits Requested:  1  . Ambulatory referral to Hematology    Referral Priority:  Routine    Referral Type:  Consultation    Referral Reason:  Specialty Services Required    Referred to Provider:  Annia Belt, MD    Requested Specialty:  Oncology    Number of Visits Requested:  1  . Ambulatory referral to Cardiology    Referral Priority:  Routine    Referral Type:  Consultation    Referral Reason:  Specialty  Services Required    Referred to Provider:  Lelon Perla, MD    Requested Specialty:  Cardiology    Number of Visits Requested:  1    Meds ordered this encounter  Medications  . UNABLE TO FIND    Sig: Med Name: Garlic take one pill  daily    Patient Instructions  - continue ASA for stroke prevention - check BP at home and record - Follow up with your primary care physician for stroke risk factor modification. Recommend maintain blood pressure goal 120-140/80, diabetes with hemoglobin A1c goal below 6.5% and lipids with LDL cholesterol goal below 100 mg/dL. - refer to hematology and cardiology for continued follow up  - reorder PT therapy for balance and gait to [facility] - follow up in 4 months.    Rosalin Hawking, MD PhD [facility] [address] [place], [place] [address] [phone]

## 2016-01-13 ENCOUNTER — Encounter: Payer: Self-pay | Admitting: *Deleted

## 2016-01-14 ENCOUNTER — Ambulatory Visit (INDEPENDENT_AMBULATORY_CARE_PROVIDER_SITE_OTHER): Payer: Medicare Other | Admitting: Cardiology

## 2016-01-14 ENCOUNTER — Encounter: Payer: Self-pay | Admitting: Cardiology

## 2016-01-14 VITALS — BP 140/82 | HR 77 | Ht 61.0 in | Wt 136.0 lb

## 2016-01-14 DIAGNOSIS — Z889 Allergy status to unspecified drugs, medicaments and biological substances status: Secondary | ICD-10-CM

## 2016-01-14 DIAGNOSIS — Z789 Other specified health status: Secondary | ICD-10-CM

## 2016-01-14 DIAGNOSIS — I6523 Occlusion and stenosis of bilateral carotid arteries: Secondary | ICD-10-CM

## 2016-01-14 DIAGNOSIS — R42 Dizziness and giddiness: Secondary | ICD-10-CM

## 2016-01-14 DIAGNOSIS — D75839 Thrombocytosis, unspecified: Secondary | ICD-10-CM

## 2016-01-14 DIAGNOSIS — D473 Essential (hemorrhagic) thrombocythemia: Secondary | ICD-10-CM

## 2016-01-14 DIAGNOSIS — I10 Essential (primary) hypertension: Secondary | ICD-10-CM | POA: Diagnosis not present

## 2016-01-14 NOTE — Progress Notes (Signed)
Cardiology Office Note  Date: 01/14/2016   ID: Stacie Garza, DOB [birth year], MRN [id number]  PCP: Stacie Neighbors, MD  Referring provider: Rosalin Hawking, MD PhD Consulting Cardiologist: Stacie Lesches, MD   Chief Complaint  Patient presents with  . Cardiac assessment  . History of dizziness    History of Present Illness: Stacie Garza is an 82 y.o. female referred for cardiology consultation by Dr. Erlinda Hong. She has a history of chronic recurring lightheadedness, has undergone prior cardiology evaluation by Dr. Hamilton Capri with the [facility] practice (followed since 2015), and also neurological assessments.   Records indicate brain MRI from 2015 showing mild chronic small vessel ischemic changes, otherwise normal findings. Carotid Dopplers at that time revealed no hemodynamic significant ICA stenoses. Holter monitoring from April 2017 described sinus rhythm, both tachycardia and bradycardia, with only occasional PACs and PVCs. No atrial fibrillation. Cardiolite from 2016 at [facility] was negative for ischemia. Echocardiogram from 2016 revealed normal LVEF with no major valvular abnormalities. Other updated reports are outlined below.  She presents today mainly to establish regular cardiology follow-up due to the departure of the [facility] practice. She tells me that she has not had any lightheadedness over the last week or so. With these events she feels unsteady with her gait, has a mild headache. No specific palpitations or chest pain however. She has not had frank syncope. I reviewed her ECG today which shows sinus rhythm with low voltage, left anterior fascicular block, and poor R-wave progression. She has a pending evaluation with PT to assess her gait. Otherwise is fairly active at her age, tends to her own chores. I reviewed her home blood pressure and heart rate checks, overall reassuring results.  She also has a history of thrombocytosis (Jak 2 negative) and was following with Dr. Tressie Stalker prior to his  departure from [place]. She would like to establish with a hematologist locally.  Past Medical History  Diagnosis Date  . Essential hypertension   . Hypothyroidism   . Hyperlipidemia   . Thrombocytosis [facility])     Past Surgical History  Procedure Laterality Date  . Hernia repair      Current Outpatient Prescriptions  Medication Sig Dispense Refill  . aspirin 325 MG tablet Take 325 mg by mouth 3 (three) times daily.     . Azilsartan Medoxomil 40 MG TABS Take by mouth.    . folic acid (FOLVITE) A999333 MCG tablet Take by mouth.    . levothyroxine (SYNTHROID, LEVOTHROID) 150 MCG tablet Take 150 mcg by mouth daily before breakfast.    . multivitamin-lutein (OCUVITE-LUTEIN) CAPS capsule Take by mouth.    [name] Glycol-Propyl Glycol 0.4-0.3 % SOLN     . UNABLE TO FIND Med Name: Garlic take one pill daily    . vitamin B-12 (CYANOCOBALAMIN) 1000 MCG tablet Take by mouth.    . vitamin C (ASCORBIC ACID) 500 MG tablet Take by mouth.    . zinc gluconate 50 MG tablet Take by mouth.     No current facility-administered medications for this visit.   Allergies:  Flaxseed (linseed); Chlorthalidone; Lavender oil; Metoprolol; Monascus purpureus went yeast; Statins; Typhoid vaccines; Atorvastatin; Bio-flax; Conjugated estrogens; Ezetimibe; and Simvastatin   Social History: The patient  reports that she has never smoked. She has never used smokeless tobacco. She reports that she does not drink alcohol or use illicit drugs.   Family History: The patient's family history is not on file.   ROS:  Please see the history of present  illness. Otherwise, complete review of systems is positive for intermittent unsteadiness and lightheadedness.  All other systems are reviewed and negative.   Physical Exam: VS:  BP 140/82 mmHg  Pulse 77  Ht 5\' 1"  (1.549 m)  Wt 136 lb (61.689 kg)  BMI 25.71 kg/m2  SpO2 95%, BMI Body mass index is 25.71 kg/(m^2).  Wt Readings from Last 3 Encounters:  01/14/16 136 lb (61.689  kg)  01/07/16 132 lb 3.2 oz (59.966 kg)  11/25/15 129 lb (58.514 kg)    General: Patient appears comfortable at rest. HEENT: Conjunctiva and lids normal, oropharynx clear with moist mucosa. Neck: Supple, no elevated JVP or carotid bruits, no thyromegaly. Lungs: Clear to auscultation, nonlabored breathing at rest. Cardiac: Regular rate and rhythm, no S3 or significant systolic murmur, no pericardial rub. Abdomen: Soft, nontender, no hepatomegaly, bowel sounds present, no guarding or rebound. Extremities: No pitting edema, distal pulses 2+. Skin: Warm and dry. Musculoskeletal: No kyphosis. Neuropsychiatric: Alert and oriented x3, affect grossly appropriate.  ECG: I personally reviewed the tracing from 03/25/2015 which showed normal sinus rhythm.  Recent Labwork:  March 2017: Hemoglobin 12.9, platelets 600 November 2015: Creatinine 0.01 October 2015: Cholesterol 190, HDL 34, LDL 127, triglycerides 145, glucose 89, TSH 7.2  Other Studies Reviewed Today:  Lexiscan Cardiolite 04/01/2015 [facility]); No diagnostic ST segment changes, no evidence of scar or ischemia, LVEF 74%.  Echocardiogram 04/18/2014 [facility]): LVEF 123456, diastolic dysfunction (ungraded), moderate MAC with trace mitral regurgitation, mildly sclerotic aortic valve, no pericardial effusion.   Brain MRI 11/26/2015: IMPRESSION:  Abnormal MRI brain (without) demonstrating: 1. Moderate periventricular and subcortical and pontine chronic small vessel ischemic disease. 2. Bilateral cerebellar chronic lacunar infarcts. 3. No acute findings.  Transcranial Dopplers 12/04/2015: Negative emboli monitoring study. No demonstration of emboli in the right or left MCA.  Carotid Dopplers 12/04/2015: Bilateral 50-69% ICA stenoses.  Assessment and Plan:  1. History of intermittent lightheadedness associated with mild headaches and gait instability. There has been question about TIA events based on chart review. Brain MRI showed evidence of  prior lacunar infarcts and small vessel disease. Transcranial Dopplers were negative and she has moderate bilateral ICA disease, otherwise no clear history of atrial arrhythmias or other cardiac specific etiology. At this point would recommend observation. If palpitations become more of a clear component to her symptoms, or she has an acute neurological event, longer-term cardiac monitoring could be considered.  2. Prior reassuring ischemic workup by Cardiolite last year. No reproducible exertional chest pain to suggest angina at this time. Current medical regimen includes aspirin.  3. History of statin intolerance as well as intolerance to Zetia. LDL 127 earlier this year.  4. Moderate bilateral ICA disease, 50-69% stenosis. Follow-up carotid Dopplers can be obtained around May of next year.  5. History of thrombocytosis, Jak 2 negative by chart review. Previously followed by Dr. Tressie Stalker, now requesting follow-up with local hematologist due to his departure. Will refer her to see Dr. [name] at [facility].  Current medicines were reviewed with the patient today.   Orders Placed This Encounter  Procedures  . Ambulatory referral to Hematology  . EKG 12-Lead    Disposition: Follow-up with me in 6 months.  Signed, Satira Sark, MD, [facility] 01/14/2016 10:49 AM    [facility] [place], [place], [place] [address] Phone: [phone]; Fax: [phone]

## 2016-01-14 NOTE — Patient Instructions (Signed)
Medication Instructions:  NONE  Labwork: NONE  Testing/Procedures: NONE  Referrals:   Dr. [name] - [facility]   Follow-Up: Your physician wants you to follow up in: 6 months.  You will receive a reminder letter in the mail one-two months in advance.  If you don't receive a letter, please call our office to schedule the follow up appointment    If you need a refill on your cardiac medications before your next appointment, please call your pharmacy.

## 2016-01-16 ENCOUNTER — Encounter: Payer: Self-pay | Admitting: *Deleted

## 2016-01-19 ENCOUNTER — Ambulatory Visit (HOSPITAL_COMMUNITY): Payer: Medicare Other | Attending: Neurology | Admitting: Physical Therapy

## 2016-01-19 DIAGNOSIS — R2681 Unsteadiness on feet: Secondary | ICD-10-CM | POA: Insufficient documentation

## 2016-01-19 DIAGNOSIS — R2689 Other abnormalities of gait and mobility: Secondary | ICD-10-CM | POA: Diagnosis not present

## 2016-01-19 DIAGNOSIS — M6281 Muscle weakness (generalized): Secondary | ICD-10-CM | POA: Diagnosis not present

## 2016-01-19 NOTE — Therapy (Signed)
[place], [place], [address] Phone: [phone]   Fax:  [phone]  Physical Therapy Evaluation  Patient Details  Name: Stacie Garza MRN: [id number] Date of Birth: 27-Dec-1927 Referring Provider: Rosalin Hawking   Encounter Date: 01/19/2016      PT End of Session - 01/19/16 1032    Visit Number 1   Number of Visits 12   Date for PT Re-Evaluation 02/09/16   Authorization Type Medicare/UHC AARP    Authorization Time Period 01/19/16 to 03/01/16   Authorization - Visit Number 1   Authorization - Number of Visits 10   PT Start Time 0945   PT Stop Time 1026   PT Time Calculation (min) 41 min   Activity Tolerance Patient tolerated treatment well   Behavior During Therapy [facility] for tasks assessed/performed      Past Medical History  Diagnosis Date  . Essential hypertension   . Hypothyroidism   . Hyperlipidemia   . Thrombocytosis [facility])     Past Surgical History  Procedure Laterality Date  . Hernia repair      There were no vitals filed for this visit.       Subjective Assessment - 01/19/16 0947    Subjective Patient reports that she had a TIA around March 2017; she reports that immediately after the TIA she was staggering badly and had to concentrate on every step. No falls or close calls recently, she is very independent. She is watching her BP closely and is taking BP medicines; the foggy, dizzy  feeling is mostly gone and her main concern is her balance right now as well as some weakness.    Pertinent History HTN, hypothyriodism, HLD   How long can you stand comfortably? no limits    How long can you walk comfortably? no limits    Patient Stated Goals focus on strength and balance    Currently in Pain? No/denies            [facility] -  PT Assessment - 01/19/16 0001    Assessment   Medical Diagnosis dizziness and imbalance    Referring Provider Jindong Xu    Onset Date/Surgical Date --  reports that TIA that  started all this was in March 2017   Next MD Visit Dr. Erlinda Hong but cannot remember exactly when    Precautions   Precautions Ward   Has the patient fallen in the past 6 months No   Has the patient had a decrease in activity level because of a fear of falling?  No   Is the patient reluctant to leave their home because of a fear of falling?  No   Prior Function   Level of Independence Independent   Vocation Retired   Leisure very independent; reading    Strength   Right Hip Flexion 4-/5   Right Hip Extension 3/5   Right Hip ABduction 3+/5   Left Hip Flexion 4-/5   Left Hip Extension 3/5   Left Hip ABduction 3/5   Right Knee Flexion 4/5   Right Knee Extension 4/5   Left Knee Flexion 4/5   Left Knee Extension 4-/5   Right Ankle Dorsiflexion 5/5   Left Ankle Dorsiflexion 5/5   6 minute walk test results    Aerobic Endurance Distance Walked 791   Endurance additional comments veers to side to side when walking, proximal muscle weakness, occasional scissoring, some deceleration on corners   signficant fatigue noted,  SOB    Berg Balance Test   Sit to Stand Able to stand without using hands and stabilize independently   Standing Unsupported Able to stand safely 2 minutes   Sitting with Back Unsupported but Feet Supported on Floor or Stool Able to sit safely and securely 2 minutes   Stand to Sit Sits safely with minimal use of hands   Transfers Able to transfer safely, minor use of hands   Standing Unsupported with Eyes Closed Able to stand 10 seconds safely   Standing Ubsupported with Feet Together Able to place feet together independently and stand 1 minute safely   From Standing, Reach Forward with Outstretched Arm Can reach confidently >25 cm (10")   From Standing Position, Pick up Object from Floor Able to pick up shoe safely and easily   From Standing Position, Turn to Look Behind Over each Shoulder Looks behind from both sides and weight shifts well   Turn 360 Degrees  Able to turn 360 degrees safely in 4 seconds or less   Standing Unsupported, Alternately Place Feet on Step/Stool Able to complete 4 steps without aid or supervision   Standing Unsupported, One Foot in [employer] balance while stepping or standing   Standing on One Leg Tries to lift leg/unable to hold 3 seconds but remains standing independently   Total Score 47                           PT Education - 01/19/16 1032    Education provided Yes   Education Details prognosis, POC, HEP, use of pedometer to increase activity level    Person(s) Educated Patient   Methods Explanation;Demonstration;Handout;Other (comment)  pedometer, red TB    Comprehension Verbalized understanding;Returned demonstration;Need further instruction          PT Short Term Goals - 01/19/16 1037    PT SHORT TERM GOAL #1   Title Patient to be able to maintain SLS each LE for at least 10 seconds in order to improve gait and to demonstrate improved overall balance skills   Time 3   Period Weeks   Status New   PT SHORT TERM GOAL #2   Title Patient to be able to list at least 5 fall precautions in order to demonstrate improved safety in home/community and to reduce fall risk    Time 3   Period Weeks   Status New   PT SHORT TERM GOAL #3   Title Patient to score at least a 50 on Berg balance test in order to demonstrate improved overall balance    Time 3   Period Weeks   Status New   PT SHORT TERM GOAL #4   Title Patient to be correctly and consistently performing appropriate HEP, to be updated PRN    Time 3   Period Weeks   Status New           PT Long Term Goals - 01/19/16 1039    PT LONG TERM GOAL #1   Title Patient to demonstrate strength 4+/5 in all tested muscle groups in order to improve overall mobilty and balance    Time 6   Period Weeks   Status New   PT LONG TERM GOAL #2   Title Patient to score at least a 53 on Berg balance test in order to demonstrate improved  condition and reduced fall risk    Time 6   Period Weeks   Status New  PT LONG TERM GOAL #3   Title Patient to demonstrate consistent gait pattern with minimal deviation and no deceleration in order to demonstrate improved mobility and reduced fall risk    Time 6   Period Weeks   Status New   PT LONG TERM GOAL #4   Title Patient to be consistently participatory in regular exercise program of moderate intensity, at least 20 minutes and 3 times per week, in order to address poor functional activity tolerance and assist in maintaining functional gains    Time 6   Period Weeks   Status New               Plan - 01/19/16 1033    Clinical Impression Statement Patient arrives, she reports, after a TIA which occurred around March of this year; she states that she really has gotten quite a bit better since this time however does continue to be concerned about her strength and balance. She also states that the foggy, dizzy feeling has for the most part resolved and she has no complaints of this today. Upon examination, patient demonstrates notable gait deviations, significant strength limitations, and unsteadiness as evidenced by reduced score on BERG balance test. Patient is quite independent per her self-report, however will benefit from skilled PT services to address functional limitations and reduce overall fall risk.    Rehab Potential Good   PT Frequency 2x / week   PT Duration 6 weeks   PT Treatment/Interventions ADLs/Self Care Home Management;Cryotherapy;Moist Heat;Gait training;Stair training;Functional mobility training;Therapeutic activities;Therapeutic exercise;Balance training;Neuromuscular re-education;Patient/family education;Manual techniques;Energy conservation;Taping   PT Next Visit Plan review HEP and goals; assess orthostatics/vestibular if dizziness returns or increases; functional strength, balance training, functional activity tolerance on Nustep or TM    PT Home Exercise  Plan given    Consulted and Agree with Plan of Care Patient      Patient will benefit from skilled therapeutic intervention in order to improve the following deficits and impairments:  Abnormal gait, Decreased endurance, Decreased activity tolerance, Decreased strength, Decreased balance, Decreased coordination  Visit Diagnosis: Other abnormalities of gait and mobility - Plan: PT plan of care cert/re-cert  Muscle weakness (generalized) - Plan: PT plan of care cert/re-cert  Unsteadiness on feet - Plan: PT plan of care cert/re-cert      G-Codes - [id number]    Functional Assessment Tool Used Based on skilled clinical assessment of strength, balance, gait    Functional Limitation Mobility: Walking and moving around   Mobility: Walking and Moving Around Current Status [id number]) At least 40 percent but less than 60 percent impaired, limited or restricted   Mobility: Walking and Moving Around Goal Status [id number]) At least 20 percent but less than 40 percent impaired, limited or restricted       Problem List Patient Active Problem List   Diagnosis Date Noted  . Dizziness 11/14/2015  . Headache 11/14/2015  . Imbalance 11/14/2015  . Essential hypertension 11/14/2015  . HLD (hyperlipidemia) 11/14/2015  . Essential thrombocythemia ([place]) 11/14/2015  . Thyroid activity decreased 11/14/2015  . HYPOTHYROIDISM 04/05/2009  . HYPERCHOLESTEROLEMIA 04/05/2009  . HYPERTENSION, UNSPECIFIED 04/05/2009  . PALPITATIONS, HX OF 04/05/2009    Deniece Ree PT, DPT [facility] [address] [place], [place], [address] Phone: [phone]   Fax:  [phone]  Name: Stacie Garza MRN: [id number] Date of Birth: [birth year]

## 2016-01-19 NOTE — Patient Instructions (Signed)
    BRIDGE  While lying on your back, raise your buttocks off the floor/bed  as shown.  Repeat 10-15 times, twice  A day.     ELASTIC BAND - SUPINE HIP ABDUCTION  While lying on your back, slowly bring your leg out to the side. Keep  your knee straight the entire time, and make sure your toes stay pointing up towards the ceiling.   Repeat 10 times each leg, twice a day.      TANDEM STANCE WITH SUPPORT  Stand in front of a chair, table or counter top for support. Then place the heel of one foot so that it is touching the toes of the other foot. Maintain your balance in this position.   Hold for 15 seconds , repeat 3 times each side, twice a day.

## 2016-01-23 ENCOUNTER — Encounter (HOSPITAL_COMMUNITY): Payer: Medicare Other | Admitting: Physical Therapy

## 2016-01-28 ENCOUNTER — Telehealth (HOSPITAL_COMMUNITY): Payer: Self-pay

## 2016-01-28 DIAGNOSIS — I252 Old myocardial infarction: Secondary | ICD-10-CM | POA: Diagnosis not present

## 2016-01-28 DIAGNOSIS — R9431 Abnormal electrocardiogram [ECG] [EKG]: Secondary | ICD-10-CM | POA: Diagnosis not present

## 2016-01-28 DIAGNOSIS — R42 Dizziness and giddiness: Secondary | ICD-10-CM | POA: Diagnosis not present

## 2016-01-28 NOTE — Telephone Encounter (Signed)
01/28/16 she was on the wait list and I called her.... She said to cx all appts... This is not what she needs

## 2016-01-30 ENCOUNTER — Encounter (HOSPITAL_COMMUNITY): Payer: Medicare Other | Attending: Hematology | Admitting: Hematology

## 2016-01-30 ENCOUNTER — Encounter (HOSPITAL_COMMUNITY): Payer: Self-pay | Admitting: Hematology

## 2016-01-30 VITALS — BP 126/72 | HR 112 | Resp 18 | Ht 61.0 in | Wt 131.7 lb

## 2016-01-30 DIAGNOSIS — I10 Essential (primary) hypertension: Secondary | ICD-10-CM

## 2016-01-30 DIAGNOSIS — I6523 Occlusion and stenosis of bilateral carotid arteries: Secondary | ICD-10-CM | POA: Diagnosis not present

## 2016-01-30 DIAGNOSIS — E039 Hypothyroidism, unspecified: Secondary | ICD-10-CM | POA: Diagnosis not present

## 2016-01-30 DIAGNOSIS — D473 Essential (hemorrhagic) thrombocythemia: Secondary | ICD-10-CM | POA: Diagnosis not present

## 2016-01-30 LAB — COMPREHENSIVE METABOLIC PANEL
ALK PHOS: 116 U/L (ref 38–126)
ALT: 15 U/L (ref 14–54)
AST: 19 U/L (ref 15–41)
Albumin: 3.8 g/dL (ref 3.5–5.0)
Anion gap: 8 (ref 5–15)
BILIRUBIN TOTAL: 0.6 mg/dL (ref 0.3–1.2)
BUN: 27 mg/dL — AB (ref 6–20)
CALCIUM: 8.4 mg/dL — AB (ref 8.9–10.3)
CHLORIDE: 101 mmol/L (ref 101–111)
CO2: 25 mmol/L (ref 22–32)
CREATININE: 0.9 mg/dL (ref 0.44–1.00)
GFR, EST NON AFRICAN AMERICAN: 57 mL/min — AB (ref >60)
Glucose, Bld: 105 mg/dL — ABNORMAL HIGH (ref 65–99)
Potassium: 3.8 mmol/L (ref 3.5–5.1)
Sodium: 134 mmol/L — ABNORMAL LOW (ref 135–145)
TOTAL PROTEIN: 7 g/dL (ref 6.5–8.1)

## 2016-01-30 LAB — FERRITIN: Ferritin: 12 ng/mL (ref 11–307)

## 2016-01-30 LAB — SEDIMENTATION RATE: SED RATE: 20 mm/h (ref 0–22)

## 2016-01-30 NOTE — Patient Instructions (Signed)
[place] at [facility] Discharge Instructions  RECOMMENDATIONS MADE BY THE CONSULTANT AND ANY TEST RESULTS WILL BE SENT TO YOUR REFERRING PHYSICIAN.  Labs today Return to Clinic to see Dr.Penland in 2 months with labs. Please call clinic with any concerns.  Thank you for choosing [place] at [facility] to provide your oncology and hematology care.  To afford each patient quality time with our provider, please arrive at least 15 minutes before your scheduled appointment time.   Beginning January 23rd 2017 lab work for the [facility] will be done in the  Main lab at [facility] on 1st floor. If you have a lab appointment with the [place] please come in thru the  Main Entrance and check in at the main information desk  You need to re-schedule your appointment should you arrive 10 or more minutes late.  We strive to give you quality time with our providers, and arriving late affects you and other patients whose appointments are after yours.  Also, if you no show three or more times for appointments you may be dismissed from the clinic at the providers discretion.     Again, thank you for choosing [facility].  Our hope is that these requests will decrease the amount of time that you wait before being seen by our physicians.       _____________________________________________________________  Should you have questions after your visit to [facility], please contact our office at [phone] between the hours of 8:30 a.m. and 4:30 p.m.  Voicemails left after 4:30 p.m. will not be returned until the following business day.  For prescription refill requests, have your pharmacy contact our office.         Resources For Cancer Patients and their Caregivers ? American Cancer Society: Can assist with transportation, wigs, general needs, runs Look Good Feel Better.        770-851-3895 ? Cancer Care: Provides  financial assistance, online support groups, medication/co-pay assistance.  1-800-813-HOPE 907-880-6244) ? [place] Assists [place] cancer patients and their families through emotional , educational and financial support.  502 465 0589 ? [place] DSS Where to apply for food stamps, Medicaid and utility assistance. 845-189-4722 ? RCATS: Transportation to medical appointments. (810)659-4011 ? Social Security Administration: May apply for disability if have a Stage IV cancer. 818-558-8226 442-559-8984 ? [employer], Disability and Transit Services: Assists with nutrition, care and transit needs. [place] Support Programs: @10RELATIVEDAYS @ > Cancer Support Group  2nd Tuesday of the month 1pm-2pm, Journey Room  > Creative Journey  3rd Tuesday of the month 1130am-1pm, Journey Room  > Look Good Feel Better  1st Wednesday of the month 10am-12 noon, Journey Room (Call [place] to register [phone])

## 2016-02-02 ENCOUNTER — Encounter (HOSPITAL_COMMUNITY): Payer: Medicare Other | Admitting: Physical Therapy

## 2016-02-04 ENCOUNTER — Encounter (HOSPITAL_COMMUNITY): Payer: Medicare Other

## 2016-02-09 ENCOUNTER — Encounter (HOSPITAL_COMMUNITY): Payer: Medicare Other | Admitting: Physical Therapy

## 2016-02-09 LAB — CALR + JAK2 E12-15 + MPL (REFLEXED)

## 2016-02-09 LAB — JAK2 V617F, W REFLEX TO CALR/E12/MPL

## 2016-02-12 ENCOUNTER — Encounter (HOSPITAL_COMMUNITY): Payer: Medicare Other

## 2016-02-16 ENCOUNTER — Encounter (HOSPITAL_COMMUNITY): Payer: Medicare Other | Admitting: Physical Therapy

## 2016-02-18 ENCOUNTER — Encounter (HOSPITAL_COMMUNITY): Payer: Medicare Other | Admitting: Physical Therapy

## 2016-02-23 ENCOUNTER — Encounter (HOSPITAL_COMMUNITY): Payer: Medicare Other | Admitting: Physical Therapy

## 2016-02-23 DIAGNOSIS — Z961 Presence of intraocular lens: Secondary | ICD-10-CM | POA: Diagnosis not present

## 2016-02-23 DIAGNOSIS — H35352 Cystoid macular degeneration, left eye: Secondary | ICD-10-CM | POA: Diagnosis not present

## 2016-02-23 DIAGNOSIS — H35372 Puckering of macula, left eye: Secondary | ICD-10-CM | POA: Diagnosis not present

## 2016-02-23 DIAGNOSIS — H5213 Myopia, bilateral: Secondary | ICD-10-CM | POA: Diagnosis not present

## 2016-02-25 ENCOUNTER — Encounter (HOSPITAL_COMMUNITY): Payer: Medicare Other

## 2016-02-26 DIAGNOSIS — T148 Other injury of unspecified body region: Secondary | ICD-10-CM | POA: Diagnosis not present

## 2016-02-26 DIAGNOSIS — D225 Melanocytic nevi of trunk: Secondary | ICD-10-CM | POA: Diagnosis not present

## 2016-02-26 DIAGNOSIS — Z808 Family history of malignant neoplasm of other organs or systems: Secondary | ICD-10-CM | POA: Diagnosis not present

## 2016-02-26 DIAGNOSIS — L723 Sebaceous cyst: Secondary | ICD-10-CM | POA: Diagnosis not present

## 2016-02-26 DIAGNOSIS — Z85828 Personal history of other malignant neoplasm of skin: Secondary | ICD-10-CM | POA: Diagnosis not present

## 2016-02-26 DIAGNOSIS — L821 Other seborrheic keratosis: Secondary | ICD-10-CM | POA: Diagnosis not present

## 2016-03-01 ENCOUNTER — Encounter (HOSPITAL_COMMUNITY): Payer: Medicare Other | Admitting: Physical Therapy

## 2016-03-03 ENCOUNTER — Encounter (HOSPITAL_COMMUNITY): Payer: Medicare Other | Admitting: Physical Therapy

## 2016-03-23 NOTE — Progress Notes (Signed)
[facility]    HEMATOLOGY/ONCOLOGY CONSULTATION NOTE  Date of Service: .01/30/2016  Patient Care Team: Celene Squibb, MD as PCP - General (Internal Medicine) Tenny Craw, MD as Referring Physician (Internal Medicine)  CHIEF COMPLAINTS/PURPOSE OF CONSULTATION:  Thrombocytosis  HISTORY OF PRESENTING ILLNESS:   Stacie Garza is a wonderful 82 y.o. female who has been referred to [place] by .Stacie Neighbors, MD and Dr Stacie Garza for evaluation and management of thrombocytosis.  Patient has a history of hypertension, dyslipidemia hypothyroidism and had been following with Dr. Everardo Garza for elevated platelets which according to her outside records have been in the range of 490k to 640k since June 1998.  Last CBC available from 10/07/2015 showed a platelet count of 600k. Hemoglobin was within normal limits at 12.9 with an MCV of 93.8 and a WBC count of 6.2k.  Outside molecular studies to evaluate for essential thrombocytosis were not available but as per her outside notes she was previously noted to be Jak2 negative.  She apparently has been having symptoms suggestive of repeated TIAs described as intermittent unsteadiness on the feet and dizziness and also notes some intermittent chest discomfort. She was recommended taking a full strength aspirin. Despite counseling to the contrary she has been taking for most aspirin 2-3 times a day and is convinced that this would be better.  She has had a cardiology evaluation and apparently has had issues with tolerating multiple medications for blood pressure control.  Patient has not had a bone marrow examination as per outside records or reports. She has not been on hydroxyurea or other platelet lowering medications.  Patient reports no focal neurological deficits or headaches at this time currently not having any chest pain. Follows with Dr. [place] Garza who is her primary care physician.  MEDICAL HISTORY:  Past Medical History:  Diagnosis Date  . Essential  hypertension   . Hyperlipidemia   . Hypothyroidism   . Thrombocytosis ([place])     SURGICAL HISTORY: Past Surgical History:  Procedure Laterality Date  . HERNIA REPAIR      SOCIAL HISTORY: Social History   Social History  . Marital status: Divorced    Spouse name: N/A  . Number of children: N/A  . Years of education: N/A   Occupational History  . Retired    Social History Main Topics  . Smoking status: Never Smoker  . Smokeless tobacco: Never Used  . Alcohol use No  . Drug use: No  . Sexual activity: Not on file   Other Topics Concern  . Not on file   Social History Narrative  . No narrative on file    FAMILY HISTORY: History reviewed. No pertinent family history.  ALLERGIES:  is allergic to flaxseed (linseed); chlorthalidone; lavender oil; metoprolol; monascus purpureus went yeast; statins; typhoid vaccines; atorvastatin; bio-flax; conjugated estrogens; ezetimibe; and simvastatin.  MEDICATIONS:  Current Outpatient Prescriptions  Medication Sig Dispense Refill  . aspirin 325 MG tablet Take 325 mg by mouth 3 (three) times daily.     . Azilsartan Medoxomil 40 MG TABS Take by mouth.    . folic acid (FOLVITE) A999333 MCG tablet Take by mouth.    . levothyroxine (SYNTHROID, LEVOTHROID) 150 MCG tablet Take 150 mcg by mouth daily before breakfast.    . multivitamin-lutein (OCUVITE-LUTEIN) CAPS capsule Take by mouth.    [name] Glycol-Propyl Glycol 0.4-0.3 % SOLN     . UNABLE TO FIND Med Name: Garlic take one pill daily    . vitamin B-12 (CYANOCOBALAMIN)  1000 MCG tablet Take by mouth.    . vitamin C (ASCORBIC ACID) 500 MG tablet Take by mouth.    . zinc gluconate 50 MG tablet Take by mouth.     No current facility-administered medications for this visit.     REVIEW OF SYSTEMS:    10 Point review of Systems was done is negative except as noted above.  PHYSICAL EXAMINATION: ECOG PERFORMANCE STATUS: 2 - Symptomatic, <50% confined to bed  . Vitals:   01/30/16  1519  BP: 126/72  Pulse: (!) 112  Resp: 18   Filed Weights   01/30/16 1519  Weight: 131 lb 11.2 oz (59.7 kg)   .Body mass index is 24.88 kg/m.  GENERAL:alert, Elderly lady in no acute distress and comfortable SKIN: skin color, texture, turgor are normal, no rashes or significant lesions EYES: normal, conjunctiva are pink and non-injected, sclera clear OROPHARYNX:no exudate, no erythema and lips, buccal mucosa, and tongue normal  NECK: supple, no JVD, thyroid normal size, non-tender, without nodularity LYMPH:  no palpable lymphadenopathy in the cervical, axillary or inguinal LUNGS: clear to auscultation with normal respiratory effort HEART: regular rate & rhythm,  no murmurs and no lower extremity edema ABDOMEN: abdomen soft, non-tender, normoactive bowel sounds  Musculoskeletal: no cyanosis of digits and no clubbing  PSYCH: alert & oriented x 3 with fluent speech NEURO: no focal motor/sensory deficits  LABORATORY DATA:  I have reviewed the data as listed   RADIOGRAPHIC STUDIES: I have personally reviewed the radiological images as listed and agreed with the findings in the report. No results found.  ASSESSMENT & PLAN:   82 year old Caucasian female with  #1 Persistent thrombocytosis since 1998. Last platelet count was 600k in March 2017 and her consultation in this range for year. We did testing see if her thrombocytosis is clonal or reactive. Jak2 V617F mutation was negative CalR mutation was negative  MPL mutation was negative Jak2 Exon12 mutation negative  Garza her molecular testing did not show definitive clonal genetic abnormalities to clearly define her condition as being essential thrombocytosis. There is still a possibility given the persistence of her thrombocytosis that she could have essential thrombocytosis. The only way to figure this out would be to get a bone marrow examination.  Plan -Patient to continue aspirin 325 mg by mouth daily. -She was again  counseled about avoiding excessive amount of aspirin use since this is counterproductive to its antiplatelet function. -Continue follow-up with her primary care physician and cardiologist. -Patient will be following up with Dr. Whitney Garza on 04/01/2016 to discuss her molecular mutation results and determine the pros and cons of a bone marrow examination. -Alternatively given she has TIA-like symptoms might not be unreasonable to consider low-dose hydroxyurea for suppression of platelets to under 400k once Garza reactive etiologies are addressed. -Her ferritin level is low at 12 and it warrants attention from a perspective of ruling out etiologies for iron deficiency anemia and also completed aggressive treatment of her iron deficiency might help with thrombocytopenia since iron deficiency can cause reactive thrombocytopenia. -Sent the patient is very sensitive to medications need to her about iron replacement on her next visit. -CBC was ordered today but apparently not done --will need to be repeated on next visit.  Return to care with Dr. Whitney Garza on 04/01/2016 for continued management.  Garza of the patients questions were answered to her apparent satisfaction. The patient knows to call the clinic with any problems, questions or concerns.  I spent 60 minutes counseling the  patient face to face. The total time spent in the appointment was 60 minutes and more than 50% was on counseling and direct patient cares.    Sullivan Lone MD [place] AAHIVMS [facility] Hematology/Oncology Physician [facility]  (Office):       [phone] (Work cell):  [phone] (Fax):           [phone]

## 2016-03-31 ENCOUNTER — Other Ambulatory Visit (HOSPITAL_COMMUNITY): Payer: Self-pay | Admitting: *Deleted

## 2016-03-31 DIAGNOSIS — D473 Essential (hemorrhagic) thrombocythemia: Secondary | ICD-10-CM

## 2016-04-01 ENCOUNTER — Encounter (HOSPITAL_COMMUNITY): Payer: Medicare Other | Attending: Hematology & Oncology | Admitting: Hematology & Oncology

## 2016-04-01 ENCOUNTER — Encounter (HOSPITAL_COMMUNITY): Payer: Self-pay | Admitting: Hematology & Oncology

## 2016-04-01 ENCOUNTER — Encounter (HOSPITAL_COMMUNITY): Payer: Medicare Other

## 2016-04-01 VITALS — BP 179/84 | HR 88 | Temp 97.5°F | Resp 16 | Wt 133.5 lb

## 2016-04-01 DIAGNOSIS — I10 Essential (primary) hypertension: Secondary | ICD-10-CM | POA: Diagnosis not present

## 2016-04-01 DIAGNOSIS — D509 Iron deficiency anemia, unspecified: Secondary | ICD-10-CM | POA: Diagnosis not present

## 2016-04-01 DIAGNOSIS — G459 Transient cerebral ischemic attack, unspecified: Secondary | ICD-10-CM | POA: Diagnosis not present

## 2016-04-01 DIAGNOSIS — D473 Essential (hemorrhagic) thrombocythemia: Secondary | ICD-10-CM | POA: Diagnosis not present

## 2016-04-01 DIAGNOSIS — E039 Hypothyroidism, unspecified: Secondary | ICD-10-CM | POA: Insufficient documentation

## 2016-04-01 DIAGNOSIS — Z9889 Other specified postprocedural states: Secondary | ICD-10-CM | POA: Insufficient documentation

## 2016-04-01 DIAGNOSIS — Z7982 Long term (current) use of aspirin: Secondary | ICD-10-CM | POA: Insufficient documentation

## 2016-04-01 DIAGNOSIS — Z8673 Personal history of transient ischemic attack (TIA), and cerebral infarction without residual deficits: Secondary | ICD-10-CM | POA: Diagnosis not present

## 2016-04-01 DIAGNOSIS — Z79899 Other long term (current) drug therapy: Secondary | ICD-10-CM | POA: Diagnosis not present

## 2016-04-01 DIAGNOSIS — E785 Hyperlipidemia, unspecified: Secondary | ICD-10-CM | POA: Insufficient documentation

## 2016-04-01 LAB — CBC WITH DIFFERENTIAL/PLATELET
BASOS PCT: 0 %
Basophils Absolute: 0 10*3/uL (ref 0.0–0.1)
EOS ABS: 0.1 10*3/uL (ref 0.0–0.7)
EOS PCT: 2 %
HCT: 41.7 % (ref 36.0–46.0)
HEMOGLOBIN: 13.4 g/dL (ref 12.0–15.0)
Lymphocytes Relative: 32 %
Lymphs Abs: 2.2 10*3/uL (ref 0.7–4.0)
MCH: 30 pg (ref 26.0–34.0)
MCHC: 32.1 g/dL (ref 30.0–36.0)
MCV: 93.3 fL (ref 78.0–100.0)
Monocytes Absolute: 0.7 10*3/uL (ref 0.1–1.0)
Monocytes Relative: 9 %
NEUTROS PCT: 57 %
Neutro Abs: 3.9 10*3/uL (ref 1.7–7.7)
PLATELETS: 633 10*3/uL — AB (ref 150–400)
RBC: 4.47 MIL/uL (ref 3.87–5.11)
RDW: 15 % (ref 11.5–15.5)
WBC: 6.9 10*3/uL (ref 4.0–10.5)

## 2016-04-01 NOTE — Patient Instructions (Signed)
[place] at [facility] Discharge Instructions  RECOMMENDATIONS MADE BY THE CONSULTANT AND ANY TEST RESULTS WILL BE SENT TO YOUR REFERRING PHYSICIAN.  You saw Dr. [name] today. Follow up at cancer center in October.  Thank you for choosing [place] at [facility] to provide your oncology and hematology care.  To afford each patient quality time with our provider, please arrive at least 15 minutes before your scheduled appointment time.   Beginning January 23rd 2017 lab work for the [facility] will be done in the  Main lab at [facility] on 1st floor. If you have a lab appointment with the [place] please come in thru the  Main Entrance and check in at the main information desk  You need to re-schedule your appointment should you arrive 10 or more minutes late.  We strive to give you quality time with our providers, and arriving late affects you and other patients whose appointments are after yours.  Also, if you no show three or more times for appointments you may be dismissed from the clinic at the providers discretion.     Again, thank you for choosing [facility].  Our hope is that these requests will decrease the amount of time that you wait before being seen by our physicians.       _____________________________________________________________  Should you have questions after your visit to [facility], please contact our office at [phone] between the hours of 8:30 a.m. and 4:30 p.m.  Voicemails left after 4:30 p.m. will not be returned until the following business day.  For prescription refill requests, have your pharmacy contact our office.         Resources For Cancer Patients and their Caregivers ? American Cancer Society: Can assist with transportation, wigs, general needs, runs Look Good Feel Better.        347 330 4365 ? Cancer Care: Provides financial assistance, online support  groups, medication/co-pay assistance.  1-800-813-HOPE 216 785 4898) ? [place] Assists [place] cancer patients and their families through emotional , educational and financial support.  510-328-9578 ? [place] DSS Where to apply for food stamps, Medicaid and utility assistance. (519)325-6853 ? RCATS: Transportation to medical appointments. (438)157-7543 ? Social Security Administration: May apply for disability if have a Stage IV cancer. 407 590 9854 636-467-0395 ? [employer], Disability and Transit Services: Assists with nutrition, care and transit needs. [place] Support Programs: @10RELATIVEDAYS @ > Cancer Support Group  2nd Tuesday of the month 1pm-2pm, Journey Room  > Creative Journey  3rd Tuesday of the month 1130am-1pm, Journey Room  > Look Good Feel Better  1st Wednesday of the month 10am-12 noon, Journey Room (Call [place] to register [phone])

## 2016-04-01 NOTE — Progress Notes (Signed)
Marland Kitchen    HEMATOLOGY/ONCOLOGY CONSULTATION NOTE  Date of Service: .01/30/2016  Patient Care Team: Stacie Squibb, MD as PCP - General (Internal Medicine) Stacie Craw, MD as Referring Physician (Internal Medicine)  CHIEF COMPLAINTS/PURPOSE OF CONSULTATION:  Thrombocytosis  HISTORY OF PRESENTING ILLNESS:   Stacie Garza is a wonderful 82 y.o. female who has been referred to [place] by .Stacie Neighbors, MD and Dr Stacie Garza for evaluation and management of thrombocytosis.  Patient is unaccompanied.   Stacie Garza believes that she only has TIA when she stops taking aspirin. She says "aspirin is the only way to avoid a stroke and she "does not intend to have a stroke." Stacie Garza currently takes three adult aspirin each day. She understands that Dr. Irene Garza advised her to not consume that quantity, especially at her age, because of its effects on her stomach. Patient says that she "will take her chances."  She states that her appetite is not as good as it used to be, but she feels like that is due to her age. Stacie Garza says that she seldom gets hungry. Although, she eats three meals a day, she feels like she has to remember to eat.  Patient denies abdominal pain.  She has been seeing Dr. Nevada Garza several years and likes him very much.   Stacie Garza mentions that the last time Dr. Nevada Garza did lab work, her portein was low.   MEDICAL HISTORY:  Past Medical History:  Diagnosis Date  . Essential hypertension   . Hyperlipidemia   . Hypothyroidism   . Thrombocytosis ([place])     SURGICAL HISTORY: Past Surgical History:  Procedure Laterality Date  . HERNIA REPAIR      SOCIAL HISTORY: Social History   Social History  . Marital status: Divorced    Spouse name: N/A  . Number of children: N/A  . Years of education: N/A   Occupational History  . Retired    Social History Main Topics  . Smoking status: Never Smoker  . Smokeless tobacco: Never Used  . Alcohol use No  . Drug use: No  . Sexual activity: Not  Currently   Other Topics Concern  . Not on file   Social History Narrative  . No narrative on file    FAMILY HISTORY: History reviewed. No pertinent family history.  ALLERGIES:  is allergic to flaxseed (linseed); chlorthalidone; lavender oil; metoprolol; monascus purpureus went yeast; statins; typhoid vaccines; atorvastatin; bio-flax; conjugated estrogens; ezetimibe; and simvastatin.  MEDICATIONS:  Current Outpatient Prescriptions  Medication Sig Dispense Refill  . aspirin 325 MG tablet Take 325 mg by mouth 3 (three) times daily.     . Azilsartan Medoxomil 40 MG TABS Take by mouth.    . folic acid (FOLVITE) 751 MCG tablet Take by mouth.    . levothyroxine (SYNTHROID, LEVOTHROID) 150 MCG tablet Take 150 mcg by mouth daily before breakfast.    . multivitamin-lutein (OCUVITE-LUTEIN) CAPS capsule Take by mouth.    Stacie Garza Glycol-Propyl Glycol 0.4-0.3 % SOLN     . UNABLE TO FIND Med Name: Garlic take one pill daily    . vitamin B-12 (CYANOCOBALAMIN) 1000 MCG tablet Take by mouth.    . vitamin C (ASCORBIC ACID) 500 MG tablet Take by mouth.    . zinc gluconate 50 MG tablet Take by mouth.     No current facility-administered medications for this visit.     REVIEW OF SYSTEMS:   14 point review of systems was performed and is negative except as detailed under  history of present illness and above Positive for loss of appetite.   PHYSICAL EXAMINATION: ECOG PERFORMANCE STATUS: 2 - Symptomatic, <50% confined to bed  . Vitals:   04/01/16 1135  BP: (!) 179/84  Pulse: 88  Resp: 16  Temp: 97.5 F (36.4 C)   Filed Weights   04/01/16 1135  Weight: 133 lb 8 oz (60.6 kg)   .Body mass index is 25.22 kg/m.  GENERAL:alert, Elderly lady in no acute distress and comfortable SKIN: skin color, texture, turgor are normal, no rashes or significant lesions EYES: normal, conjunctiva are pink and non-injected, sclera clear OROPHARYNX:no exudate, no erythema and lips, buccal mucosa, and  tongue normal  NECK: supple, no JVD, thyroid normal size, non-tender, without nodularity LYMPH:  no palpable lymphadenopathy in the cervical, axillary or inguinal LUNGS: clear to auscultation with normal respiratory effort HEART: regular rate & rhythm,  no murmurs and no lower extremity edema ABDOMEN: abdomen soft, non-tender, normoactive bowel sounds  Musculoskeletal: no cyanosis of digits and no clubbing  PSYCH: alert & oriented x 3 with fluent speech NEURO: no focal motor/sensory deficits  LABORATORY DATA:  I have reviewed the data as listed Results for Stacie, Garza (MRN [id number]) as of 04/01/2016 09:23  Ref. Range 01/30/2016 16:30  Sodium Latest Ref Range: 135 - 145 mmol/L 134 (L)  Potassium Latest Ref Range: 3.5 - 5.1 mmol/L 3.8  Chloride Latest Ref Range: 101 - 111 mmol/L 101  CO2 Latest Ref Range: 22 - 32 mmol/L 25  BUN Latest Ref Range: 6 - 20 mg/dL 27 (H)  Creatinine Latest Ref Range: 0.44 - 1.00 mg/dL 0.90  Calcium Latest Ref Range: 8.9 - 10.3 mg/dL 8.4 (L)  EGFR (Non-African Amer.) Latest Ref Range: >60 mL/min 57 (L)  EGFR (African American) Latest Ref Range: >60 mL/min >60  Glucose Latest Ref Range: 65 - 99 mg/dL 105 (H)  Anion gap Latest Ref Range: 5 - 15  8  Alkaline Phosphatase Latest Ref Range: 38 - 126 U/L 116  Albumin Latest Ref Range: 3.5 - 5.0 g/dL 3.8  AST Latest Ref Range: 15 - 41 U/L 19  ALT Latest Ref Range: 14 - 54 U/L 15  Total Protein Latest Ref Range: 6.5 - 8.1 g/dL 7.0  Total Bilirubin Latest Ref Range: 0.3 - 1.2 mg/dL 0.6  Ferritin Latest Ref Range: 11 - 307 ng/mL 12    RADIOGRAPHIC STUDIES: I have personally reviewed the radiological images as listed and agreed with the findings in the report. No results found.  ASSESSMENT & PLAN:  Thrombocytosis Iron deficiency Negative MPD evaluation Hx TIA  82 year old Caucasian female with history of TIA and persistent thrombocytosis. MPD evaluation is negative, I did briefly address a BMBX with her today.  She is not interested in pursuing this noting "my platelets have always been high." She is not willing to consider a trial of hydrea. I am concerned about her iron deficiency, especially with her ongoing aspirin use but she will not let me even talk about this today.   I did express to her that at times iron deficiency can cause thrombocytosis.   I also expressed to her that with her history of TIA that keeping her platelet count below 450K would be beneficial to her.   Stacie Garza says she has an appointment with Dr. Nevada Garza at the end of the month and she has lab work scheduled the week before her appointment. She said she would want talk to Dr. Nevada Garza before making any decisions.   I  will call Dr. Nevada Garza and explain to him what we found and he can discuss this with her in order to make a decision about what Stacie Garza is most comfortable doing.   This document serves as a record of services personally performed by Ancil Linsey, MD. It was created on her behalf by Elmyra Ricks, a trained medical scribe. The creation of this record is based on the scribe's personal observations and the provider's statements to them. This document has been checked and approved by the attending provider.  I have reviewed the above documentation for accuracy and completeness, and I agree with the above.  Molli Hazard, MD

## 2016-04-02 ENCOUNTER — Telehealth (HOSPITAL_COMMUNITY): Payer: Self-pay | Admitting: *Deleted

## 2016-04-02 NOTE — Telephone Encounter (Signed)
Pt states that she thought she was supposed to have a ferritin level drawn. I advised the pt that in Dr. Grier Mitts note he only mentioned her getting a CBC which was drawn.

## 2016-04-14 DIAGNOSIS — I10 Essential (primary) hypertension: Secondary | ICD-10-CM | POA: Diagnosis not present

## 2016-04-14 DIAGNOSIS — R7301 Impaired fasting glucose: Secondary | ICD-10-CM | POA: Diagnosis not present

## 2016-04-14 DIAGNOSIS — E782 Mixed hyperlipidemia: Secondary | ICD-10-CM | POA: Diagnosis not present

## 2016-04-14 DIAGNOSIS — E039 Hypothyroidism, unspecified: Secondary | ICD-10-CM | POA: Diagnosis not present

## 2016-04-21 DIAGNOSIS — D696 Thrombocytopenia, unspecified: Secondary | ICD-10-CM | POA: Diagnosis not present

## 2016-04-21 DIAGNOSIS — Z8673 Personal history of transient ischemic attack (TIA), and cerebral infarction without residual deficits: Secondary | ICD-10-CM | POA: Diagnosis not present

## 2016-04-21 DIAGNOSIS — Z0000 Encounter for general adult medical examination without abnormal findings: Secondary | ICD-10-CM | POA: Diagnosis not present

## 2016-04-21 DIAGNOSIS — I10 Essential (primary) hypertension: Secondary | ICD-10-CM | POA: Diagnosis not present

## 2016-04-21 DIAGNOSIS — E039 Hypothyroidism, unspecified: Secondary | ICD-10-CM | POA: Diagnosis not present

## 2016-04-21 DIAGNOSIS — R7301 Impaired fasting glucose: Secondary | ICD-10-CM | POA: Diagnosis not present

## 2016-05-12 ENCOUNTER — Ambulatory Visit (INDEPENDENT_AMBULATORY_CARE_PROVIDER_SITE_OTHER): Payer: Medicare Other | Admitting: Neurology

## 2016-05-12 ENCOUNTER — Encounter: Payer: Self-pay | Admitting: Neurology

## 2016-05-12 VITALS — BP 151/96 | HR 81 | Wt 130.4 lb

## 2016-05-12 DIAGNOSIS — E785 Hyperlipidemia, unspecified: Secondary | ICD-10-CM

## 2016-05-12 DIAGNOSIS — I6523 Occlusion and stenosis of bilateral carotid arteries: Secondary | ICD-10-CM | POA: Diagnosis not present

## 2016-05-12 DIAGNOSIS — I10 Essential (primary) hypertension: Secondary | ICD-10-CM

## 2016-05-12 DIAGNOSIS — D473 Essential (hemorrhagic) thrombocythemia: Secondary | ICD-10-CM

## 2016-05-12 NOTE — Patient Instructions (Addendum)
-   continue ASA for stroke prevention and high platelet - check BP at home and record - Follow up with your primary care physician for stroke risk factor modification. Recommend maintain blood pressure goal 120-140/80, diabetes with hemoglobin A1c goal below 6.5% and lipids with LDL cholesterol goal below 100 mg/dL. - refer to hematology for continued follow up with high platelet and low iron level. - follow up as needed.

## 2016-05-12 NOTE — Progress Notes (Signed)
NEUROLOGY CLINIC FOLLOW UP NOTE  NAME: Stacie Garza DOB: [birth year]  HPI: Stacie Garza is a 82 y.o. female with PMH of HTN, hypothyroidism, HLD who presents as a new patient for dizziness.   Pt stated that started in 2015, she occasional has lightheadedness on getting up from chair or bed. In 01/2014, she had episode of chest pain during reading, nausea but no vomiting, went to bed, and felt good the second day. Went to see Dr. Hamilton Capri and extensive cardiac work up all negative. Since then, she started to have intermittent lightheadedness on walking, motion related, felt foggy headed and staggering, could lasting a whole day long. Initially, once every a couple of months, but since 05/2015, it increased to once every month. Since this year, it is more frequent, and now became all the time whenever she starts to walk, unstable with gait and like to hold onto things, fear of falling, however, she never had fall and never used any cane or walker, denies HA. She never felt lightheadedness when she is sitting or lying. She also complains of CP on wakening up in the middle of night, once every Garza weeks since this year. She had follow up with Dr. Hamilton Capri and also did 24 hour Holter monitoring was negative. She was referred to neurology for further evaluation.  She has hx of HTN and on azilsartan, BP today 140/85. HLD but not tolerating statins, LDL 157 in Garza/2015. Hypothyroidism on synthroid. She also has essential thrombocythemia followed with Dr. Tressie Stalker and JAK2 was positive. She is on ASA, last platelet level 600 in 09/2015. She denies hx of stroke. Denies any depression or anxiety.  She has CUS in 2015 which was unremarkable. Denies smoking or alcohol. Mom died of old age, and dad died of heart disease at age of 59, sister died of cancer.   Jan 11, 2016 follow up - pt has been doing well. Still complain some gait imbalance, stumbles but less severe and had no fall. She has been working on the yard  planting trees and flowers, and has declined PT/OT due to her busy schedule. She also declined sleep study as she believes she does not have OSA. She had MRI brain which showed no acute infarct, but punctate cerebellar lacunes, apparently asymptomatic. She had CUS, and TCD MES were both negative. She BP at home between 110-130, although in clinic today 149/84. She is on ASA but not statin due to allergy.   Interval history: During the interval time, she has been doing well, no more dizziness or lightheadedness. Walking much improved, now at her baseline. Had one session of PT treatment, however not able to tolerate. Followed with Dr. [name] for thrombocytosis, checked Stacie Garza, Stacie Garza as well as Stacie Garza mutations, all negative. However, discovered very low ferritin level. About to start iron supplements therapy. Also followed with PCP for BP management. BP today 151/94. She has been on aspirin 325 3 times a day for long time, tolerating well. Does not want to change aspirin dosage at this time.   Past Medical History:  Diagnosis Date  . Essential hypertension   . Hyperlipidemia   . Hypothyroidism   . Thrombocytosis ([place])    Past Surgical History:  Procedure Laterality Date  . HERNIA REPAIR     No family history on file. Current Outpatient Prescriptions  Medication Sig Dispense Refill  . aspirin 325 MG tablet Take 325 mg by mouth 3 (three) times daily.     . Azilsartan Medoxomil 40 MG  TABS Take by mouth.    . folic acid (FOLVITE) 097 MCG tablet Take by mouth.    . levothyroxine (SYNTHROID, LEVOTHROID) 150 MCG tablet Take 150 mcg by mouth daily before breakfast. Take 173mcg is every other day    . multivitamin-lutein (OCUVITE-LUTEIN) CAPS capsule Take by mouth.    [name] Glycol-Propyl Glycol 0.4-0.3 % SOLN     . UNABLE TO FIND Med Name: Garlic take one pill daily    . vitamin B-12 (CYANOCOBALAMIN) 1000 MCG tablet Take by mouth.    . vitamin C (ASCORBIC ACID) 500 MG tablet Take by mouth.    .  zinc gluconate 50 MG tablet Take by mouth.     No current facility-administered medications for this visit.    Allergies  Allergen Reactions  . Flaxseed (Linseed) Itching, Other (See Comments) and Rash    Blood in stool  . Chlorthalidone Other (See Comments)    Dry eye (Eyelid stuck to eyeball)  . Lavender Oil Other (See Comments)    Head felt dizzy and foggy, left arm pain, fatigue, off balance  . Metoprolol Other (See Comments)    Headache,  Insomnia, Unstable gait, Lethargy, intermittent SOB.  (Last dose 07/24/2014)  . Monascus Purpureus [employer] Other (See Comments) and Nausea Only    Off balance, head felt heavy, aching in shoulders, fatigue, tightness in chest  . Statins Other (See Comments)    Pain in back of head and neck, UTI, back pain  . Typhoid Vaccines Other (See Comments)    redness  . Atorvastatin   . Bio-Flax   . Conjugated Estrogens   . Ezetimibe   . Simvastatin    Social History   Social History  . Marital status: Divorced    Spouse name: N/A  . Number of children: N/A  . Years of education: N/A   Occupational History  . Retired    Social History Main Topics  . Smoking status: Never Smoker  . Smokeless tobacco: Never Used  . Alcohol use No  . Drug use: No  . Sexual activity: Not Currently   Other Topics Concern  . Not on file   Social History Narrative  . No narrative on file    Review of Systems Full 14 system review of systems performed and notable only for those listed, all others are neg:  Constitutional:   Cardiovascular:  Ear/Nose/Throat:   Skin:  Eyes:   Respiratory:  SOB at times Gastroitestinal:   Genitourinary:  Hematology/Lymphatic:   Endocrine:  Musculoskeletal:   Allergy/Immunology:   Neurological:  weakness, dizziness Psychiatric:  Sleep: snoring   Physical Exam  Vitals:   05/12/16 1117  BP: (!) 151/96  Pulse: 81    General - Well nourished, well developed, in no apparent distress.  Ophthalmologic -  Sharp disc margins OU.  Cardiovascular - Regular rate and rhythm with no murmur.   Neck - supple, no nuchal rigidity .  Mental Status -  Level of arousal and orientation to time, place, and person were intact. Language including expression, naming, repetition, comprehension, reading, and writing was assessed and found intact. Attention span and concentration were normal. Recent and remote memory were intact, 3/3 registration and 3/3 delayed recall Fund of Knowledge was assessed and was intact.  Cranial Nerves II - XII - II - Visual field intact OU. III, IV, VI - Extraocular movements intact. V - Facial sensation intact bilaterally. VII - Facial movement intact bilaterally. VIII - Hearing & vestibular intact bilaterally. X -  Palate elevates symmetrically. XI - Chin turning & shoulder shrug intact bilaterally. XII - Tongue protrusion intact.  Motor Strength - The patient's strength was normal in all extremities and pronator drift was absent.  Bulk was normal and fasciculations were absent.   Motor Tone - Muscle tone was assessed at the neck and appendages and was normal.  Reflexes - The patient's reflexes were normal in all extremities and she had no pathological reflexes.  Sensory - Light touch, temperature/pinprick were assessed and were normal.    Coordination - The patient had normal movements in the hands and feet with no ataxia or dysmetria.  Tremor was absent.  Gait and Station - normal gait, station.  Imaging 06/11/14 MRI brain - Mild chronic small vessel ischemic changes. Otherwise normal.  06/11/14 CUS - 1. No hemodynamically significant stenosis on either side Garza. Both vertebral arteries are patent with antegrade flow.  MRI 11/26/15 1. Moderate periventricular and subcortical and pontine chronic small vessel ischemic disease. Garza. Bilateral cerebellar chronic lacunar infarcts. 3. No acute findings.  CUS 12/04/15- right ICA 50-69% stenosis  TCD emboli detection 12/04/15  - no MES  Lab Review 10/07/15 platelet 600, LDL 127  05/28/14 ANA neg, B12 1198 (on B12 1020mcg daily), RPR neg, A1C 5.9  JAK2 V617F, Rfx Stacie Garza/E12/MPL5 - negative  Component     Latest Ref Rng & Units 01/30/2016         4:30 PM  Ferritin     11 - 307 ng/mL 12      Assessment:   In summary, ONDINE GEMME is a 82 y.o. female with PMH of HTN, hypothyroidism, HLD, thrombocytosis (possible essential thrombocythemia) who presents as a new patient for chronic lightheadedness. Initially intermittent but gradually increased frequency and now all the time, but only happens with walking and motion related. Exam showed more functional component. Her condition more likely chronic subjective dizziness, but due to risk factors of HTN, HLD not tolerance to statin, essential thrombocythemia, need to rule out stroke first. MRI no acute infarct but punctate cerebellar lacunes. CUS right ICA 50-69% stenosis and TCD MES negative. Followed with hematology for essential thrombocythemia, and JAK2, CLAR and Stacie Garza gene mutation negative. However, very low ferritin levels. She'll continue follow up with Dr. [name] for iron supplement therapy. Dizziness and lightheadedness as well as walking difficulty resolved. Not able to tolerate PT for balance training. Still on aspirin 325 mg 3 times a day for long time.  Plan: - continue ASA for stroke prevention and thrombocytosis - check BP at home and record - Follow up with primary care physician for stroke risk factor modification. Recommend maintain blood pressure goal 120-140/80, diabetes with hemoglobin A1c goal below 6.5% and lipids with LDL cholesterol goal below 100 mg/dL. - Follow-up with hematology for thrombocytosis and low ferritin level. - follow up as needed.   No orders of the defined types were placed in this encounter.   No orders of the defined types were placed in this encounter.   Patient Instructions  - continue ASA for stroke prevention and high  platelet - check BP at home and record - Follow up with your primary care physician for stroke risk factor modification. Recommend maintain blood pressure goal 120-140/80, diabetes with hemoglobin A1c goal below 6.5% and lipids with LDL cholesterol goal below 100 mg/dL. - refer to hematology for continued follow up with high platelet and low iron level. - follow up as needed.   Rosalin Hawking, MD PhD [facility] [phone]  [address], [place], [place] [address] [phone]

## 2016-05-19 DIAGNOSIS — E039 Hypothyroidism, unspecified: Secondary | ICD-10-CM | POA: Diagnosis not present

## 2016-05-19 DIAGNOSIS — D696 Thrombocytopenia, unspecified: Secondary | ICD-10-CM | POA: Diagnosis not present

## 2016-05-19 DIAGNOSIS — I10 Essential (primary) hypertension: Secondary | ICD-10-CM | POA: Diagnosis not present

## 2016-05-25 ENCOUNTER — Ambulatory Visit (HOSPITAL_COMMUNITY): Payer: Medicare Other | Admitting: Hematology & Oncology

## 2016-05-31 DIAGNOSIS — Z23 Encounter for immunization: Secondary | ICD-10-CM | POA: Diagnosis not present

## 2016-07-06 ENCOUNTER — Encounter (HOSPITAL_COMMUNITY): Payer: Medicare Other | Attending: Hematology & Oncology | Admitting: Hematology & Oncology

## 2016-07-06 ENCOUNTER — Encounter (HOSPITAL_COMMUNITY): Payer: Self-pay | Admitting: Hematology & Oncology

## 2016-07-06 VITALS — BP 163/88 | HR 92 | Temp 97.6°F | Resp 16 | Wt 131.6 lb

## 2016-07-06 DIAGNOSIS — Z79899 Other long term (current) drug therapy: Secondary | ICD-10-CM | POA: Insufficient documentation

## 2016-07-06 DIAGNOSIS — Z8673 Personal history of transient ischemic attack (TIA), and cerebral infarction without residual deficits: Secondary | ICD-10-CM | POA: Insufficient documentation

## 2016-07-06 DIAGNOSIS — Z9889 Other specified postprocedural states: Secondary | ICD-10-CM | POA: Insufficient documentation

## 2016-07-06 DIAGNOSIS — D473 Essential (hemorrhagic) thrombocythemia: Secondary | ICD-10-CM | POA: Insufficient documentation

## 2016-07-06 DIAGNOSIS — E611 Iron deficiency: Secondary | ICD-10-CM

## 2016-07-06 DIAGNOSIS — G459 Transient cerebral ischemic attack, unspecified: Secondary | ICD-10-CM | POA: Diagnosis not present

## 2016-07-06 DIAGNOSIS — Z7982 Long term (current) use of aspirin: Secondary | ICD-10-CM | POA: Diagnosis not present

## 2016-07-06 DIAGNOSIS — E785 Hyperlipidemia, unspecified: Secondary | ICD-10-CM | POA: Insufficient documentation

## 2016-07-06 DIAGNOSIS — E039 Hypothyroidism, unspecified: Secondary | ICD-10-CM | POA: Diagnosis not present

## 2016-07-06 DIAGNOSIS — I10 Essential (primary) hypertension: Secondary | ICD-10-CM | POA: Insufficient documentation

## 2016-07-06 MED ORDER — POLYSACCHAR IRON-FA-B12 150-1-25 MG-MG-MCG PO CAPS: 1.0000 | ORAL_CAPSULE | Freq: Every day | ORAL | 1 refills | Status: DC

## 2016-07-06 NOTE — Progress Notes (Signed)
HEMATOLOGY/ONCOLOGY CONSULTATION NOTE  Date of Service: .07/06/2016  Patient Care Team: Celene Squibb, MD as PCP - General (Internal Medicine) Tenny Craw, MD as Referring Physician (Internal Medicine)  CHIEF COMPLAINTS/PURPOSE OF CONSULTATION:  Thrombocytosis Iron deficiency  HISTORY OF PRESENTING ILLNESS:   Stacie Garza is a wonderful 82 y.o. female who is here for a follow-up of thrombocytosis. She also has iron deficiency. She continues to take three adult aspirin daily. Since our last visit in September she has seen Dr. [place] Garza. She denies any TIA symptoms.   She has recently seen family over the Thanksgiving holiday.   Patient is unaccompanied today. She reports fatigue, this is not new.   She requested copies of lab work. She states that her platelets have always been high. She notes that she didn't understand her last visits here and would like to take her lab work to review.  Stacie Garza denies abdominal pain. No bleeding or bruising.  No headaches or visual changes.    MEDICAL HISTORY:  Past Medical History:  Diagnosis Date  . Essential hypertension   . Hyperlipidemia   . Hypothyroidism   . Thrombocytosis ([place])     SURGICAL HISTORY: Past Surgical History:  Procedure Laterality Date  . HERNIA REPAIR      SOCIAL HISTORY: Social History   Social History  . Marital status: Divorced    Spouse name: N/A  . Number of children: N/A  . Years of education: N/A   Occupational History  . Retired    Social History Main Topics  . Smoking status: Never Smoker  . Smokeless tobacco: Never Used  . Alcohol use No  . Drug use: No  . Sexual activity: Not Currently   Other Topics Concern  . Not on file   Social History Narrative  . No narrative on file    FAMILY HISTORY: History reviewed. No pertinent family history.  ALLERGIES:  is allergic to flaxseed (linseed); chlorthalidone; lavender oil; metoprolol; monascus purpureus went yeast; statins; typhoid vaccines;  atorvastatin; bio-flax; conjugated estrogens; ezetimibe; and simvastatin.  MEDICATIONS:  Current Outpatient Prescriptions  Medication Sig Dispense Refill  . aspirin 325 MG tablet Take 325 mg by mouth 3 (three) times daily.     . Azilsartan Medoxomil 40 MG TABS Take by mouth.    . folic acid (FOLVITE) 010 MCG tablet Take by mouth.    . levothyroxine (SYNTHROID, LEVOTHROID) 150 MCG tablet Take 150 mcg by mouth daily before breakfast. Take 143mcg is every other day    . multivitamin-lutein (OCUVITE-LUTEIN) CAPS capsule Take by mouth.    Stacie Garza Glycol-Propyl Glycol 0.4-0.3 % SOLN     . UNABLE TO FIND Med Name: Garlic take one pill daily    . vitamin B-12 (CYANOCOBALAMIN) 1000 MCG tablet Take by mouth.    . vitamin C (ASCORBIC ACID) 500 MG tablet Take by mouth.    . zinc gluconate 50 MG tablet Take by mouth.    . Polysacchar Iron-FA-B12 (FERREX 150 FORTE) 150-1-25 MG-MG-MCG CAPS Take 1 capsule by mouth daily. 30 capsule 1   No current facility-administered medications for this visit.     REVIEW OF SYSTEMS:   14 point review of systems was performed and is negative except as detailed under history of present illness and above Positive for loss of appetite.   PHYSICAL EXAMINATION: ECOG PERFORMANCE STATUS: 2 - Symptomatic, <50% confined to bed  . Vitals:   07/06/16 1446  BP: (!) 163/88  Pulse: 92  Resp: 16  Temp:  97.6 F (36.4 C)   Filed Weights   07/06/16 1446  Weight: 131 lb 9.6 oz (59.7 kg)   .Body mass index is 24.87 kg/m.  GENERAL:alert, Elderly lady in no acute distress and comfortable SKIN: skin color, texture, turgor are normal, no rashes or significant lesions EYES: normal, conjunctiva are pink and non-injected, sclera clear OROPHARYNX:no exudate, no erythema and lips, buccal mucosa, and tongue normal  NECK: supple, no JVD, thyroid normal size, non-tender, without nodularity LYMPH:  no palpable lymphadenopathy in the cervical, axillary or inguinal LUNGS: clear  to auscultation with normal respiratory effort HEART: regular rate & rhythm,  no murmurs and no lower extremity edema ABDOMEN: abdomen soft, non-tender, normoactive bowel sounds  Musculoskeletal: no cyanosis of digits and no clubbing  PSYCH: alert & oriented x 3 with fluent speech NEURO: no focal motor/sensory deficits  LABORATORY DATA:  I have reviewed the data as listed  Results for Stacie, Garza (MRN [id number]) as of 07/06/2016 20:50  Ref. Range 04/01/2016 10:19  WBC Latest Ref Range: 4.0 - 10.5 K/uL 6.9  RBC Latest Ref Range: 3.87 - 5.11 MIL/uL 4.47  Hemoglobin Latest Ref Range: 12.0 - 15.0 g/dL 13.4  HCT Latest Ref Range: 36.0 - 46.0 % 41.7  MCV Latest Ref Range: 78.0 - 100.0 fL 93.3  MCH Latest Ref Range: 26.0 - 34.0 pg 30.0  MCHC Latest Ref Range: 30.0 - 36.0 g/dL 32.1  RDW Latest Ref Range: 11.5 - 15.5 % 15.0  Platelets Latest Ref Range: 150 - 400 K/uL 633 (H)  Neutrophils Latest Units: % 57  Lymphocytes Latest Units: % 32  Monocytes Relative Latest Units: % 9  Eosinophil Latest Units: % 2  Basophil Latest Units: % 0  NEUT# Latest Ref Range: 1.7 - 7.7 K/uL 3.9  Lymphocyte # Latest Ref Range: 0.7 - 4.0 K/uL 2.2  Monocyte # Latest Ref Range: 0.1 - 1.0 K/uL 0.7  Eosinophils Absolute Latest Ref Range: 0.0 - 0.7 K/uL 0.1  Basophils Absolute Latest Ref Range: 0.0 - 0.1 K/uL 0.0    RADIOGRAPHIC STUDIES: I have personally reviewed the radiological images as listed and agreed with the findings in the report. No results found.  ASSESSMENT & PLAN:  Thrombocytosis Iron deficiency Negative MPD evaluation Hx TIA Aspirin use  82 year old Caucasian female with history of TIA and persistent thrombocytosis. MPD evaluation is negative, I did briefly address a BMBX with her today. She is not interested in pursuing this noting "my platelets have always been high." She is not willing to consider a trial of hydrea. I am concerned about her iron deficiency, especially with her ongoing  aspirin use.I did express to her that at times iron deficiency can cause thrombocytosis.   She is willing to try an oral iron supplement. I have called her in niferex/ferrex forte once daily. She was given copies of all prior labs. No labs were obtained today but we spent time reviewing prior laboratory studies obtained at her visits here in July and September.  I will follow up with Stacie Garza after the holidays.   Meds ordered this encounter  Medications  . Polysacchar Iron-FA-B12 (FERREX 150 FORTE) 150-1-25 MG-MG-MCG CAPS    Sig: Take 1 capsule by mouth daily.    Dispense:  30 capsule    Refill:  1    This document serves as a record of services personally performed by Ancil Linsey, MD. It was created on her behalf by Elmyra Ricks, a trained medical scribe. The creation of this record is  based on the scribe's personal observations and the provider's statements to them. This document has been checked and approved by the attending provider.  I have reviewed the above documentation for accuracy and completeness, and I agree with the above.  Molli Hazard, MD

## 2016-07-06 NOTE — Patient Instructions (Addendum)
[place] at [facility] Discharge Instructions  RECOMMENDATIONS MADE BY THE CONSULTANT AND ANY TEST RESULTS WILL BE SENT TO YOUR REFERRING PHYSICIAN.  You saw Dr.Penland today. Follow up in January. Prescription for [employer] (Iron) See Amy at checkout for appointments.  Thank you for choosing [place] at [facility] to provide your oncology and hematology care.  To afford each patient quality time with our provider, please arrive at least 15 minutes before your scheduled appointment time.   Beginning January 23rd 2017 lab work for the [facility] will be done in the  Main lab at [facility] on 1st floor. If you have a lab appointment with the [facility] please come in thru the  Main Entrance and check in at the main information desk  You need to re-schedule your appointment should you arrive 10 or more minutes late.  We strive to give you quality time with our providers, and arriving late affects you and other patients whose appointments are after yours.  Also, if you no show three or more times for appointments you may be dismissed from the clinic at the providers discretion.     Again, thank you for choosing [facility].  Our hope is that these requests will decrease the amount of time that you wait before being seen by our physicians.       _____________________________________________________________  Should you have questions after your visit to [facility], please contact our office at [phone] between the hours of 8:30 a.m. and 4:30 p.m.  Voicemails left after 4:30 p.m. will not be returned until the following business day.  For prescription refill requests, have your pharmacy contact our office.         Resources For Cancer Patients and their Caregivers ? American Cancer Society: Can assist with transportation, wigs, general needs, runs Look Good Feel Better.         (825) 814-3226 ? Cancer Care: Provides financial assistance, online support groups, medication/co-pay assistance.  1-800-813-HOPE 334-150-2651) ? [facility] Assists [place] cancer patients and their families through emotional , educational and financial support.  (418)567-8170 ? [place] DSS Where to apply for food stamps, Medicaid and utility assistance. 306-814-0230 ? [facility]: Transportation to medical appointments. (640)703-3651 ? Social Security Administration: May apply for disability if have a Stage IV cancer. 463-724-6277 662-520-5950 ? [employer], Disability and Transit Services: Assists with nutrition, care and transit needs. [place] Support Programs: @10RELATIVEDAYS @ > Cancer Support Group  2nd Tuesday of the month 1pm-2pm, Journey Room  > Creative Journey  3rd Tuesday of the month 1130am-1pm, Journey Room  > Look Good Feel Better  1st Wednesday of the month 10am-12 noon, Journey Room (Call [place] to register [phone])

## 2016-07-14 ENCOUNTER — Ambulatory Visit: Payer: Medicare Other | Admitting: Cardiology

## 2016-08-12 ENCOUNTER — Ambulatory Visit (HOSPITAL_COMMUNITY): Payer: Medicare Other | Admitting: Hematology & Oncology

## 2016-08-13 ENCOUNTER — Encounter: Payer: Self-pay | Admitting: Cardiology

## 2016-08-13 ENCOUNTER — Ambulatory Visit (INDEPENDENT_AMBULATORY_CARE_PROVIDER_SITE_OTHER): Payer: Medicare Other | Admitting: Cardiology

## 2016-08-13 VITALS — BP 145/83 | HR 90 | Ht 61.0 in | Wt 131.6 lb

## 2016-08-13 DIAGNOSIS — I779 Disorder of arteries and arterioles, unspecified: Secondary | ICD-10-CM | POA: Diagnosis not present

## 2016-08-13 DIAGNOSIS — D473 Essential (hemorrhagic) thrombocythemia: Secondary | ICD-10-CM

## 2016-08-13 DIAGNOSIS — Z789 Other specified health status: Secondary | ICD-10-CM | POA: Diagnosis not present

## 2016-08-13 DIAGNOSIS — D75839 Thrombocytosis, unspecified: Secondary | ICD-10-CM

## 2016-08-13 DIAGNOSIS — I10 Essential (primary) hypertension: Secondary | ICD-10-CM

## 2016-08-13 DIAGNOSIS — I739 Peripheral vascular disease, unspecified: Principal | ICD-10-CM

## 2016-08-13 NOTE — Patient Instructions (Signed)
Medication Instructions:  Continue all current medications.  Labwork: none  Testing/Procedures: Your physician has requested that you have a carotid duplex. This test is an ultrasound of the carotid arteries in your neck. It looks at blood flow through these arteries that supply the brain with blood. Allow one hour for this exam. There are no restrictions or special instructions. - DUE JUST PRIOR TO NEXT OFFICE VISIT.   Follow-Up: Your physician wants you to follow up in: 6 months.  You will receive a reminder letter in the mail one-two months in advance.  If you don't receive a letter, please call our office to schedule the follow up appointment   Any Other Special Instructions Will Be Listed Below (If Applicable).  If you need a refill on your cardiac medications before your next appointment, please call your pharmacy.

## 2016-08-13 NOTE — Progress Notes (Signed)
Cardiology Office Note  Date: 08/13/2016   ID: [name], DOB [birth year], MRN [id number]  PCP: [name], MD  Primary Cardiologist: Rozann Lesches, MD   Chief Complaint  Patient presents with  . Carotid artery disease    History of Present Illness: Stacie Garza is an 83 y.o. female that established follow-up back in June 2017. She presents for a routine follow-up visit. Reports no significant palpitations or exertional chest pain.  She is following in the oncology clinic at [facility] for management of thrombocytosis.  We went over her medications which are outlined below. She continues on aspirin, has a history of statin intolerance.  Carotid Dopplers from May 2017 are outlined below. We discussed the results with anticipated follow-up study in the next 6 months.  Past Medical History:  Diagnosis Date  . Essential hypertension   . Hyperlipidemia   . Hypothyroidism   . Thrombocytosis ([place])     Past Surgical History:  Procedure Laterality Date  . HERNIA REPAIR      Current Outpatient Prescriptions  Medication Sig Dispense Refill  . aspirin 325 MG tablet Take 325 mg by mouth 3 (three) times daily.     . Azilsartan Medoxomil 40 MG TABS Take 20 mg by mouth daily.     . folic acid (FOLVITE) 737 MCG tablet Take by mouth.    . levothyroxine (SYNTHROID, LEVOTHROID) 150 MCG tablet Take 150 mcg by mouth as directed. Alternating with 123mg every other day    . multivitamin-lutein (OCUVITE-LUTEIN) CAPS capsule Take by mouth.    .Vladimir FasterGlycol-Propyl Glycol 0.4-0.3 % SOLN     . UNABLE TO FIND Med Name: Garlic take one pill daily    . vitamin B-12 (CYANOCOBALAMIN) 1000 MCG tablet Take by mouth.    . vitamin C (ASCORBIC ACID) 500 MG tablet Take by mouth.    . zinc gluconate 50 MG tablet Take by mouth.     No current facility-administered medications for this visit.    Allergies:  Flaxseed (linseed); Chlorthalidone; Lavender oil; Metoprolol; Monascus purpureus went  yeast; Statins; Typhoid vaccines; Atorvastatin; Bio-flax; Conjugated estrogens; Ezetimibe; and Simvastatin   Social History: The patient  reports that she has never smoked. She has never used smokeless tobacco. She reports that she does not drink alcohol or use drugs.   ROS:  Please see the history of present illness. Otherwise, complete review of systems is positive for none.  All other systems are reviewed and negative.   Physical Exam: VS:  BP (!) 145/83   Pulse 90   Ht _0  (1.549 m)   Wt 131 lb 9.6 oz (59.7 kg)   BMI 24.87 kg/m , BMI Body mass index is 24.87 kg/m.  Wt Readings from Last 3 Encounters:  08/13/16 131 lb 9.6 oz (59.7 kg)  07/06/16 131 lb 9.6 oz (59.7 kg)  05/12/16 130 lb 6.4 oz (59.1 kg)    General: Patient appears comfortable at rest. HEENT: Conjunctiva and lids normal, oropharynx clear with moist mucosa. Neck: Supple, no elevated JVP, soft right carotid bruit, no thyromegaly. Lungs: Clear to auscultation, nonlabored breathing at rest. Cardiac: Regular rate and rhythm, no S3 or significant systolic murmur, no pericardial rub. Abdomen: Soft, nontender, bowel sounds present. Extremities: No pitting edema, distal pulses 2+. Skin: Warm and dry. Musculoskeletal: No kyphosis. Neuropsychiatric: Alert and oriented x3, affect grossly appropriate.  ECG: I personally reviewed the tracing from 01/14/2016 which showed sinus rhythm with low voltage, poor R-wave progression, and left  anterior fascicular block.  Recent Labwork: 01/30/2016: ALT 15; AST 19; BUN 27; Creatinine, Ser 0.90; Potassium 3.8; Sodium 134 04/01/2016: Hemoglobin 13.4; Platelets 633   Other Studies Reviewed Today:  Lexiscan Cardiolite 04/01/2015 [facility]); No diagnostic ST segment changes, no evidence of scar or ischemia, LVEF 74%.  Echocardiogram 04/18/2014 [facility]): LVEF 10-31%, diastolic dysfunction (ungraded), moderate MAC with trace mitral regurgitation, mildly sclerotic aortic valve, no pericardial  effusion.   Brain MRI 11/26/2015: IMPRESSION:  Abnormal MRI brain (without) demonstrating: 1. Moderate periventricular and subcortical and pontine chronic small vessel ischemic disease. 2. Bilateral cerebellar chronic lacunar infarcts. 3. No acute findings.  Transcranial Dopplers 12/04/2015: Negative emboli monitoring study. No demonstration of emboli in the right or left MCA.  Carotid Dopplers 12/04/2015: Bilateral 50-69% ICA stenoses.  Assessment and Plan:  1. Moderate bilateral carotid artery disease. Follow-up carotid Dopplers to be arranged around the time of her next visit in 6 months. Continue aspirin.  2. History of statin intolerance.  3. Essential hypertension, no changes made present regimen. Keep follow-up with Dr. [name].  4. Thrombocytosis, followed by Dr. [name].  Current medicines were reviewed with the patient today.  Disposition: Follow-up in 6 months.  Signed, Satira Sark, MD, [facility] 08/13/2016 2:44 PM    [place] at [place], [place], [place] [address] Phone: [phone]; Fax: [phone]

## 2016-08-31 DIAGNOSIS — H35371 Puckering of macula, right eye: Secondary | ICD-10-CM | POA: Diagnosis not present

## 2016-08-31 DIAGNOSIS — Z961 Presence of intraocular lens: Secondary | ICD-10-CM | POA: Diagnosis not present

## 2016-08-31 DIAGNOSIS — H52203 Unspecified astigmatism, bilateral: Secondary | ICD-10-CM | POA: Diagnosis not present

## 2016-09-15 NOTE — Progress Notes (Signed)
Stacie Garza, [place] [address]   CLINIC:  Medical Oncology/Hematology  PCP:  [name], MD [address] [place] [address] [phone]   REASON FOR VISIT:  Follow-up for thrombocytosis AND iron deficiency anemia   CURRENT THERAPY: Observation AND oral iron supplementation     HISTORY OF PRESENT ILLNESS:  (From Dr. Donald Pore last note on 07/06/16)     INTERVAL HISTORY:  Stacie Garza returns to cancer center for routine follow-up for thrombocytosis and iron deficiency anemia.    She starts our visit by asking why she has to see the nurse practitioner today and not the doctor.  She is not pleased with having to see this provider today. I offered to have her reschedule her appointment for when Dr. Talbert Cage has availability, but she stated, "I guess you can just go ahead and do what you have to do."    She has history of TIA; denies any extremity weakness.  She denies any recent dizziness, headache, chest pain, or shortness of breath. She sees her PCP, Dr. [name], several times per year.    She tells me she took the prescription iron supplement for about 1 month, but she stopped due to severe constipation.  She has instead been taking OTC iron 65 mg per day.  She has had no side effects of the OTC iron.  Denies any blood in her stools, hematuria, nosebleeds, or bleeding from her gums.    Her energy and appetite are both down a bit, but this has been a chronic issue for her.     REVIEW OF SYSTEMS:  Review of Systems  Constitutional: Positive for appetite change and fatigue. Negative for chills and fever.  HENT:  Negative.  Negative for nosebleeds.   Eyes: Negative.   Respiratory: Negative.  Negative for cough and shortness of breath.   Cardiovascular: Negative.  Negative for chest pain and palpitations.  Gastrointestinal: Positive for constipation. Negative for abdominal pain, nausea and vomiting.  Endocrine: Negative.   Genitourinary:  Negative.  Negative for dysuria, hematuria and vaginal bleeding.   Musculoskeletal: Negative.   Skin: Negative.  Negative for rash.  Neurological: Negative.  Negative for dizziness, extremity weakness and headaches.  Hematological: Negative.  Does not bruise/bleed easily.  Psychiatric/Behavioral: Negative.      PAST MEDICAL/SURGICAL HISTORY:  Past Medical History:  Diagnosis Date  . Essential hypertension   . Hyperlipidemia   . Hypothyroidism   . Thrombocytosis ([facility])    Past Surgical History:  Procedure Laterality Date  . HERNIA REPAIR       SOCIAL HISTORY:  Social History   Social History  . Marital status: Divorced    Spouse name: N/A  . Number of children: N/A  . Years of education: N/A   Occupational History  . Retired    Social History Main Topics  . Smoking status: Never Smoker  . Smokeless tobacco: Never Used  . Alcohol use No  . Drug use: No  . Sexual activity: Not Currently   Other Topics Concern  . Not on file   Social History Narrative  . No narrative on file    FAMILY HISTORY:  History reviewed. No pertinent family history.  CURRENT MEDICATIONS:  Outpatient Encounter Prescriptions as of 09/16/2016  Medication Sig Note  . aspirin 325 MG tablet Take 325 mg by mouth 3 (three) times daily.  11/13/2015: Received from: [facility]  . Azilsartan Medoxomil 40 MG TABS Take 20 mg by mouth  daily.  05/12/2016: Take based on her blood pressure readings  . folic acid (FOLVITE) 704 MCG tablet Take by mouth. 11/13/2015: Received from: [facility]  . IRON PO Take 65 mg by mouth.   . levothyroxine (SYNTHROID, LEVOTHROID) 150 MCG tablet Take 150 mcg by mouth as directed. Alternating with 129mg every other day   . multivitamin-lutein (OCUVITE-LUTEIN) CAPS capsule Take by mouth. 11/13/2015: Received from: [facility] . Polyethyl Glycol-Propyl Glycol 0.4-0.3 % SOLN  [facility] UNABLE TO FIND Med Name: Garlic take one pill daily     . vitamin C (ASCORBIC ACID) 500 MG tablet Take by mouth. 11/13/2015: Received from: [facility] . zinc gluconate 50 MG tablet Take by mouth. 11/13/2015: [place] from: [facility] . vitamin B-12 (CYANOCOBALAMIN) 1000 MCG tablet Take by mouth. 11/13/2015: Received from: [facility]  No facility-administered encounter medications on file as of 09/16/2016.     ALLERGIES:  Allergies  Allergen Reactions  . Flaxseed (Linseed) Itching, Other (See Comments) and Rash    Blood in stool  . Chlorthalidone Other (See Comments)    Dry eye (Eyelid stuck to eyeball)  . Lavender Oil Other (See Comments)    Head felt dizzy and foggy, left arm pain, fatigue, off balance  . Metoprolol Other (See Comments)    Headache,  Insomnia, Unstable gait, Lethargy, intermittent SOB.  (Last dose 07/24/2014)  . Monascus Purpureus WBlack & DeckerOther (See Comments) and Nausea Only    Off balance, head felt heavy, aching in shoulders, fatigue, tightness in chest  . Statins Other (See Comments)    Pain in back of head and neck, UTI, back pain  . Typhoid Vaccines Other (See Comments)    redness  . Atorvastatin   . Bio-Flax   . Conjugated Estrogens   . Ezetimibe   . Simvastatin      PHYSICAL EXAM:  ECOG Performance status: 1 - Symptomatic, but independent.   Vitals:   09/16/16 1310  BP: (!) 154/84  Pulse: (!) 105  Resp: 16  Temp: 97.6 F (36.4 C)   Filed Weights   09/16/16 1310  Weight: 127 lb 8 oz (57.8 kg)    Physical Exam  Constitutional: She is oriented to person, place, and time and well-developed, well-nourished, and in no distress.  HENT:  Head: Normocephalic.  Mouth/Throat: Oropharynx is clear and moist. No oropharyngeal exudate.  Eyes: Conjunctivae are normal. Pupils are equal, round, and reactive to light. No scleral icterus.  Neck: Normal range of motion. Neck supple.  Cardiovascular: Normal rate, regular rhythm and normal heart sounds.   Pulmonary/Chest: Effort normal and breath  sounds normal. No respiratory distress.  Abdominal: Soft. Bowel sounds are normal. There is no hepatosplenomegaly. There is no tenderness.  Musculoskeletal: Normal range of motion. She exhibits no edema.  Lymphadenopathy:    She has no cervical adenopathy.       Right: No supraclavicular adenopathy present.       Left: No supraclavicular adenopathy present.  Neurological: She is alert and oriented to person, place, and time. No cranial nerve deficit. Gait normal.  Skin: Skin is warm and dry. No rash noted.  Psychiatric: Mood, memory, affect and judgment normal.  Nursing note and vitals reviewed.    LABORATORY DATA:  I have reviewed the labs as listed.  CBC    Component Value Date/Time   WBC 7.5 09/16/2016 1423   RBC 4.48 09/16/2016 1423   HGB 14.3 09/16/2016 1423  HCT 42.9 09/16/2016 1423   PLT 524 (H) 09/16/2016 1423   MCV 95.8 09/16/2016 1423   MCH 31.9 09/16/2016 1423   MCHC 33.3 09/16/2016 1423   RDW 14.1 09/16/2016 1423   LYMPHSABS 2.0 09/16/2016 1423   MONOABS 0.8 09/16/2016 1423   EOSABS 0.2 09/16/2016 1423   BASOSABS 0.0 09/16/2016 1423   CMP Latest Ref Rng & Units 09/16/2016 01/30/2016 05/22/2007  Glucose 65 - 99 mg/dL 102(H) 105(H) 90  BUN 6 - 20 mg/dL 16 27(H) 15  Creatinine 0.44 - 1.00 mg/dL 0.73 0.90 0.73  Sodium 135 - 145 mmol/L 136 134(L) 136  Potassium 3.5 - 5.1 mmol/L 4.3 3.8 3.5  Chloride 101 - 111 mmol/L 102 101 100  CO2 22 - 32 mmol/L '28 25 26  ' Calcium 8.9 - 10.3 mg/dL 8.9 8.4(L) 8.3(L)  Total Protein 6.5 - 8.1 g/dL 7.0 7.0 -  Total Bilirubin 0.3 - 1.2 mg/dL 0.6 0.6 -  Alkaline Phos 38 - 126 U/L 98 116 -  AST 15 - 41 U/L 22 19 -  ALT 14 - 54 U/L 20 15 -    PENDING LABS:  Anemia panel pending   DIAGNOSTIC IMAGING:    PATHOLOGY:     ASSESSMENT & PLAN:   Thrombocytosis, MPD eval negative:  -We briefly discussed several etiologies of elevated platelet counts. I reiterated previous discussions she has had with Dr. [name], that to know for  sure why she has thrombocytosis, a bone marrow biopsy would be a beneficial diagnostic test.  I explained the procedure of having a bone marrow biopsy, both with and without image guidance or sedation.  She continues to decline having a bone marrow biopsy.  -We discussed initiating a trial of Hydrea. I explained the mechanism of action of Hydrea in the treatment of elevated platelets.  She understands that she is at increased risk of stroke with elevated platelets. She continues to decline a trial of Hydrea. Patient understands that there may come a time when her platelet count becomes critical and intervention will be necessary.  She prefers to continue to observe for now without active treatment.  -Written patient education materials about Hydrea given to the patient today for her review.  -Patient did not have labs prior to today's visit. Labs resulted later in the day.  -Platelet count remains elevated at 524,000. We will call patient to make her aware.   Iron deficiency anemia:  -She was unable to tolerate prescription strength oral iron d/t constipation. I will collect repeat iron studies today. We briefly discussed the use of IV iron to replete iron stores with oral iron intolerance. We discussed the small risk of hypersensitivity reaction with IV iron, but this is pretty rare; generally IV iron is tolerated quite well with largely no side effects.  Although likely inadequate, she can continue OTC iron for now.  -We will contact her with lab studies once they are made available.       Dispo:  -Labs today.  -Return in 3 months with repeat labs to see Dr. Talbert Cage. This patient should not see [facility] in the future per her expressed wishes.    All questions were answered to patient's stated satisfaction. Encouraged patient to call with any new concerns or questions before her next visit to the cancer center and we can certain see her sooner, if needed.    Plan of care  discussed with Dr. Talbert Cage, who agrees with the above aforementioned.    Orders placed this  encounter:  Orders Placed This Encounter  Procedures  . Vitamin B12  . Folate  . Iron and TIBC  . Ferritin  . CBC with Differential/Platelet  . Comprehensive metabolic panel      Mike Craze, NP [place] [phone]

## 2016-09-16 ENCOUNTER — Encounter (HOSPITAL_COMMUNITY): Payer: Self-pay | Admitting: Adult Health

## 2016-09-16 ENCOUNTER — Encounter (HOSPITAL_COMMUNITY): Payer: Medicare Other | Attending: Adult Health | Admitting: Adult Health

## 2016-09-16 ENCOUNTER — Encounter (HOSPITAL_COMMUNITY): Payer: Medicare Other

## 2016-09-16 ENCOUNTER — Ambulatory Visit (HOSPITAL_COMMUNITY): Payer: Medicare Other | Admitting: Hematology & Oncology

## 2016-09-16 ENCOUNTER — Other Ambulatory Visit (HOSPITAL_COMMUNITY): Payer: Self-pay | Admitting: *Deleted

## 2016-09-16 VITALS — BP 154/84 | HR 105 | Temp 97.6°F | Resp 16 | Wt 127.5 lb

## 2016-09-16 DIAGNOSIS — I10 Essential (primary) hypertension: Secondary | ICD-10-CM | POA: Diagnosis not present

## 2016-09-16 DIAGNOSIS — E611 Iron deficiency: Secondary | ICD-10-CM

## 2016-09-16 DIAGNOSIS — Z8673 Personal history of transient ischemic attack (TIA), and cerebral infarction without residual deficits: Secondary | ICD-10-CM | POA: Diagnosis not present

## 2016-09-16 DIAGNOSIS — D473 Essential (hemorrhagic) thrombocythemia: Secondary | ICD-10-CM | POA: Diagnosis not present

## 2016-09-16 DIAGNOSIS — E039 Hypothyroidism, unspecified: Secondary | ICD-10-CM | POA: Insufficient documentation

## 2016-09-16 DIAGNOSIS — E785 Hyperlipidemia, unspecified: Secondary | ICD-10-CM | POA: Insufficient documentation

## 2016-09-16 DIAGNOSIS — Z7982 Long term (current) use of aspirin: Secondary | ICD-10-CM | POA: Diagnosis not present

## 2016-09-16 DIAGNOSIS — Z79899 Other long term (current) drug therapy: Secondary | ICD-10-CM | POA: Insufficient documentation

## 2016-09-16 DIAGNOSIS — D509 Iron deficiency anemia, unspecified: Secondary | ICD-10-CM | POA: Diagnosis not present

## 2016-09-16 DIAGNOSIS — Z9889 Other specified postprocedural states: Secondary | ICD-10-CM | POA: Insufficient documentation

## 2016-09-16 LAB — COMPREHENSIVE METABOLIC PANEL
ALBUMIN: 3.8 g/dL (ref 3.5–5.0)
ALK PHOS: 98 U/L (ref 38–126)
ALT: 20 U/L (ref 14–54)
ANION GAP: 6 (ref 5–15)
AST: 22 U/L (ref 15–41)
BUN: 16 mg/dL (ref 6–20)
CALCIUM: 8.9 mg/dL (ref 8.9–10.3)
CO2: 28 mmol/L (ref 22–32)
Chloride: 102 mmol/L (ref 101–111)
Creatinine, Ser: 0.73 mg/dL (ref 0.44–1.00)
GFR calc Af Amer: >60 mL/min (ref >60)
GFR calc non Af Amer: >60 mL/min (ref >60)
GLUCOSE: 102 mg/dL — AB (ref 65–99)
Potassium: 4.3 mmol/L (ref 3.5–5.1)
SODIUM: 136 mmol/L (ref 135–145)
Total Bilirubin: 0.6 mg/dL (ref 0.3–1.2)
Total Protein: 7 g/dL (ref 6.5–8.1)

## 2016-09-16 LAB — CBC WITH DIFFERENTIAL/PLATELET
BASOS ABS: 0 10*3/uL (ref 0.0–0.1)
BASOS PCT: 1 %
Eosinophils Absolute: 0.2 10*3/uL (ref 0.0–0.7)
Eosinophils Relative: 2 %
HEMATOCRIT: 42.9 % (ref 36.0–46.0)
Hemoglobin: 14.3 g/dL (ref 12.0–15.0)
Lymphocytes Relative: 27 %
Lymphs Abs: 2 10*3/uL (ref 0.7–4.0)
MCH: 31.9 pg (ref 26.0–34.0)
MCHC: 33.3 g/dL (ref 30.0–36.0)
MCV: 95.8 fL (ref 78.0–100.0)
MONO ABS: 0.8 10*3/uL (ref 0.1–1.0)
Monocytes Relative: 11 %
NEUTROS ABS: 4.4 10*3/uL (ref 1.7–7.7)
NEUTROS PCT: 59 %
PLATELETS: 524 10*3/uL — AB (ref 150–400)
RBC: 4.48 MIL/uL (ref 3.87–5.11)
RDW: 14.1 % (ref 11.5–15.5)
WBC: 7.5 10*3/uL (ref 4.0–10.5)

## 2016-09-16 LAB — VITAMIN B12: Vitamin B-12: 2728 pg/mL — ABNORMAL HIGH (ref 180–914)

## 2016-09-16 LAB — FOLATE: Folate: 29.7 ng/mL (ref >5.9)

## 2016-09-16 LAB — LACTATE DEHYDROGENASE: LDH: 123 U/L (ref 98–192)

## 2016-09-16 LAB — IRON AND TIBC
IRON: 51 ug/dL (ref 28–170)
Saturation Ratios: 15 % (ref 10.4–31.8)
TIBC: 337 ug/dL (ref 250–450)
UIBC: 286 ug/dL

## 2016-09-16 LAB — FERRITIN: FERRITIN: 32 ng/mL (ref 11–307)

## 2016-09-16 NOTE — Patient Instructions (Signed)
[place] at [facility] Discharge Instructions  RECOMMENDATIONS MADE BY THE CONSULTANT AND ANY TEST RESULTS WILL BE SENT TO YOUR REFERRING PHYSICIAN.  Exam with Mike Craze, NP.  You will have labs today. You will return to the clinic in 2 months with labs.   We are adding some information on Hydrea for you to review.   Hydroxyurea capsules What is this medicine? HYDROXYUREA (hye drox ee yoor EE a) is a chemotherapy drug. This medicine is used to treat certain types of leukemias and head and neck cancer. It is also used to control the painful crises of sickle cell anemia. This medicine may be used for other purposes; ask your health care provider or pharmacist if you have questions. COMMON BRAND NAME(S): Droxia, Hydrea What should I tell my health care provider before I take this medicine? They need to know if you have any of these conditions: -HIV or AIDS -kidney disease or on hemodialysis -liver disease -low blood counts, like low white cell, platelet, or red cell counts -prior or current interferon therapy -recent or ongoing radiation therapy -an unusual or allergic reaction to hydroxyurea, other chemotherapy, other medicines, foods, dyes, or preservatives -pregnant or trying to get pregnant -breast-feeding How should I use this medicine? Take this medicine by mouth with a glass of water. Follow the directions on the prescription label. Take your medicine at regular intervals. Do not take it more often than directed. Do not stop taking except on your doctor's advice. People who are not taking this medicine should not be exposed to it. Wash your hands before and after handling your bottle or medicine. Caregivers should wear disposable gloves if they must touch the bottle or medicine. Clean up any medicine powder that spills with a damp disposable towel and throw the towel away in a closed container, such as a plastic bag. Talk to your pediatrician regarding  the use of this medicine in children. Special care may be needed. Overdosage: If you think you have taken too much of this medicine contact a poison control center or emergency room at once. NOTE: This medicine is only for you. Do not share this medicine with others. What if I miss a dose? If you miss a dose, take it as soon as you can. If it is almost time for your next dose, take only that dose. Do not take double or extra doses. What may interact with this medicine? This medicine may also interact with the following medications: -didanosine -stavudine -live virus vaccines This list may not describe all possible interactions. Give your health care provider a list of all the medicines, herbs, non-prescription drugs, or dietary supplements you use. Also tell them if you smoke, drink alcohol, or use illegal drugs. Some items may interact with your medicine. What should I watch for while using this medicine? This drug may make you feel generally unwell. This is not uncommon, as chemotherapy can affect healthy cells as well as cancer cells. Report any side effects. Continue your course of treatment even though you feel ill unless your doctor tells you to stop. You will receive regular blood tests during your treatment. Call your doctor or health care professional for advice if you get a fever, chills or sore throat, or other symptoms of a cold or flu. Do not treat yourself. This drug decreases your body's ability to fight infections. Try to avoid being around people who are sick. This medicine may increase your risk to bruise or bleed. Call  your doctor or health care professional if you notice any unusual bleeding. Talk to your doctor about your risk of cancer. You may be more at risk for certain types of cancers if you take this medicine. You may need blood work done while you are taking this medicine. Do not become pregnant while taking this medicine or for at least 6 months after stopping it. Women  should inform their doctor if they wish to become pregnant or think they might be pregnant. Men should not father a child while taking this medicine and for at least a year after stopping it. There is a potential for serious side effects to an unborn child. Talk to your health care professional or pharmacist for more information. Do not breast-feed an infant while taking this medicine. This may interfere with the ability to have or father a child. You should talk with your doctor or health care professional if you are concerned about your fertility. What side effects may I notice from receiving this medicine? Side effects that you should report to your doctor or health care professional as soon as possible: -allergic reactions like skin rash, itching or hives, swelling of the face, lips, or tongue -low blood counts - this medicine may decrease the number of white blood cells, red blood cells and platelets. You may be at increased risk for infections and bleeding. -signs of infection - fever or chills, cough, sore throat, pain or difficulty passing urine -signs of decreased platelets or bleeding - bruising, pinpoint red spots on the skin, black, tarry stools, blood in the urine -signs of decreased red blood cells - unusually weak or tired, fainting spells, lightheadedness -breathing problems -burning, redness or pain at the site of any radiation therapy -changes in skin color -confusion -mouth sores -pain, tingling, numbness in the hands or feet -seizures -skin ulcers -trouble passing urine or change in the amount of urine -vomiting Side effects that usually do not require medical attention (report to your doctor or health care professional if they continue or are bothersome): -headache -loss of appetite -red color to the face This list may not describe all possible side effects. Call your doctor for medical advice about side effects. You may report side effects to FDA at 1-800-FDA-1088. Where  should I keep my medicine? Keep out of the reach of children. See product for storage instructions. Each product may have different instructions. Keep tightly closed. Throw away any unused medicine after the expiration date. NOTE: This sheet is a summary. It may not cover all possible information. If you have questions about this medicine, talk to your doctor, pharmacist, or health care provider.  2017 Elsevier/Gold Standard (2014-10-25 16:51:41)   Thank you for choosing [employer] at [facility] to provide your oncology and hematology care.  To afford each patient quality time with our provider, please arrive at least 15 minutes before your scheduled appointment time.    If you have a lab appointment with the [facility] please come in thru the  Main Entrance and check in at the main information desk  You need to re-schedule your appointment should you arrive 10 or more minutes late.  We strive to give you quality time with our providers, and arriving late affects you and other patients whose appointments are after yours.  Also, if you no show three or more times for appointments you may be dismissed from the clinic at the providers discretion.     Again, thank you for choosing  [facility]  Our hope is that these requests will decrease the amount of time that you wait before being seen by our physicians.       _____________________________________________________________  Should you have questions after your visit to [facility], please contact our office at [phone] between the hours of 8:30 a.m. and 4:30 p.m.  Voicemails left after 4:30 p.m. will not be returned until the following business day.  For prescription refill requests, have your pharmacy contact our office.       Resources For Cancer Patients and their Caregivers ? American Cancer Society: Can assist with transportation, wigs, general needs, runs Look Good Feel Better.         ([phone] 034-8993 ? Cancer Care: Provides financial assistance, online support groups, medication/co-pay assistance.  1-800-813-HOPE (445)294-6954) ? [facility] Assists [place] cancer patients and their families through emotional , educational and financial support.  613-088-4102 ? [place] DSS Where to apply for food stamps, Medicaid and utility assistance. 304-321-9905 ? [facility]: Transportation to medical appointments. (228)757-8840 ? Social Security Administration: May apply for disability if have a Stage IV cancer. (352)223-1954 669-603-0262 ? [employer], Disability and Transit Services: Assists with nutrition, care and transit needs. [place] Support Programs: @10RELATIVEDAYS @ > Cancer Support Group  2nd Tuesday of the month 1pm-2pm, Journey Room  > Creative Journey  3rd Tuesday of the month 1130am-1pm, Journey Room  > Look Good Feel Better  1st Wednesday of the month 10am-12 noon, Journey Room (Call [facility] to register [phone])

## 2016-09-22 ENCOUNTER — Other Ambulatory Visit: Payer: Self-pay | Admitting: Cardiology

## 2016-09-22 DIAGNOSIS — R42 Dizziness and giddiness: Secondary | ICD-10-CM

## 2016-09-22 MED ORDER — AZILSARTAN MEDOXOMIL 40 MG PO TABS: 20.0000 mg | ORAL_TABLET | Freq: Every day | ORAL | 1 refills | Status: DC

## 2016-09-22 NOTE — Telephone Encounter (Signed)
Azilsartan Medoxomil 40 MG TABS   [facility] refills

## 2016-10-12 ENCOUNTER — Encounter (HOSPITAL_COMMUNITY): Payer: Self-pay | Admitting: *Deleted

## 2016-10-14 ENCOUNTER — Telehealth: Payer: Self-pay | Admitting: Hematology

## 2016-10-14 NOTE — Telephone Encounter (Signed)
Pt's daughter cld wanting to transfer her mother's care from [facility]. Scheduled the pt to see Dr. Irene Limbo on 4/24 at 11am. She has agreed to the appt date and time.

## 2016-11-15 ENCOUNTER — Ambulatory Visit (HOSPITAL_COMMUNITY): Payer: Medicare Other

## 2016-11-16 ENCOUNTER — Telehealth: Payer: Self-pay | Admitting: Hematology

## 2016-11-16 ENCOUNTER — Ambulatory Visit (HOSPITAL_BASED_OUTPATIENT_CLINIC_OR_DEPARTMENT_OTHER): Payer: Medicare Other

## 2016-11-16 ENCOUNTER — Encounter: Payer: Self-pay | Admitting: Hematology

## 2016-11-16 ENCOUNTER — Ambulatory Visit (HOSPITAL_BASED_OUTPATIENT_CLINIC_OR_DEPARTMENT_OTHER): Payer: Medicare Other | Admitting: Hematology

## 2016-11-16 VITALS — BP 153/82 | HR 91 | Temp 98.0°F | Resp 18 | Wt 125.4 lb

## 2016-11-16 DIAGNOSIS — E538 Deficiency of other specified B group vitamins: Secondary | ICD-10-CM

## 2016-11-16 DIAGNOSIS — D509 Iron deficiency anemia, unspecified: Secondary | ICD-10-CM | POA: Diagnosis not present

## 2016-11-16 DIAGNOSIS — D508 Other iron deficiency anemias: Secondary | ICD-10-CM

## 2016-11-16 DIAGNOSIS — D473 Essential (hemorrhagic) thrombocythemia: Secondary | ICD-10-CM

## 2016-11-16 DIAGNOSIS — E039 Hypothyroidism, unspecified: Secondary | ICD-10-CM | POA: Diagnosis not present

## 2016-11-16 LAB — CBC & DIFF AND RETIC
BASO%: 1 % (ref 0.0–2.0)
BASOS ABS: 0.1 10*3/uL (ref 0.0–0.1)
EOS ABS: 0.2 10*3/uL (ref 0.0–0.5)
EOS%: 4.4 % (ref 0.0–7.0)
HEMATOCRIT: 44.2 % (ref 34.8–46.6)
HEMOGLOBIN: 14.9 g/dL (ref 11.6–15.9)
IMMATURE RETIC FRACT: 2.6 % (ref 1.60–10.00)
LYMPH%: 35.8 % (ref 14.0–49.7)
MCH: 32.2 pg (ref 25.1–34.0)
MCHC: 33.7 g/dL (ref 31.5–36.0)
MCV: 95.5 fL (ref 79.5–101.0)
MONO#: 0.7 10*3/uL (ref 0.1–0.9)
MONO%: 14.1 % — ABNORMAL HIGH (ref 0.0–14.0)
NEUT%: 44.7 % (ref 38.4–76.8)
NEUTROS ABS: 2.2 10*3/uL (ref 1.5–6.5)
PLATELETS: 567 10*3/uL — AB (ref 145–400)
RBC: 4.63 10*6/uL (ref 3.70–5.45)
RDW: 13.3 % (ref 11.2–14.5)
Retic %: 1.06 % (ref 0.70–2.10)
Retic Ct Abs: 49.08 10*3/uL (ref 33.70–90.70)
WBC: 5 10*3/uL (ref 3.9–10.3)
lymph#: 1.8 10*3/uL (ref 0.9–3.3)

## 2016-11-16 LAB — IRON AND TIBC
%SAT: 19 % — ABNORMAL LOW (ref 21–57)
Iron: 71 ug/dL (ref 41–142)
TIBC: 366 ug/dL (ref 236–444)
UIBC: 295 ug/dL (ref 120–384)

## 2016-11-16 LAB — COMPREHENSIVE METABOLIC PANEL
ALBUMIN: 3.8 g/dL (ref 3.5–5.0)
ALK PHOS: 116 U/L (ref 40–150)
ALT: 17 U/L (ref 0–55)
AST: 18 U/L (ref 5–34)
Anion Gap: 12 mEq/L — ABNORMAL HIGH (ref 3–11)
BILIRUBIN TOTAL: 1.01 mg/dL (ref 0.20–1.20)
BUN: 16.2 mg/dL (ref 7.0–26.0)
CALCIUM: 9.3 mg/dL (ref 8.4–10.4)
CO2: 28 mEq/L (ref 22–29)
Chloride: 101 mEq/L (ref 98–109)
Creatinine: 0.9 mg/dL (ref 0.6–1.1)
EGFR: 58 mL/min/{1.73_m2} — AB (ref >90)
GLUCOSE: 100 mg/dL (ref 70–140)
Potassium: 3.7 mEq/L (ref 3.5–5.1)
SODIUM: 140 meq/L (ref 136–145)
TOTAL PROTEIN: 7.2 g/dL (ref 6.4–8.3)

## 2016-11-16 LAB — FERRITIN: Ferritin: 69 ng/ml (ref 9–269)

## 2016-11-16 NOTE — Progress Notes (Signed)
Marland Kitchen  HEMATOLOGY ONCOLOGY PROGRESS NOTE  Date of service: .11/16/2016  Patient Care Team: Celene Squibb, MD as PCP - General (Internal Medicine) Tenny Craw, MD as Referring Physician (Internal Medicine)  CC: - f/u for chronic thrombocytosis ET vs reactive -Iron deficiency anemia  Diagnosis:   Chronic thrombocytosis-> ET vs reactive  HPI  Stacie Garza is a wonderful 83 y.o. female who has been referred to [place] by .[name], MD and Dr [name] for evaluation and management of thrombocytosis.  Patient has a history of hypertension, dyslipidemia hypothyroidism and had been following with Dr. [name] for elevated platelets which according to her outside records have been in the range of 490k to 640k since June 1998.  Last CBC available from 10/07/2015 showed a platelet count of 600k. Hemoglobin was within normal limits at 12.9 with an MCV of 93.8 and a WBC count of 6.2k.  Outside molecular studies to evaluate for essential thrombocytosis were not available but as per her outside notes she was previously noted to be Jak2 negative.  She apparently has been having symptoms suggestive of repeated TIAs described as intermittent unsteadiness on the feet and dizziness and also notes some intermittent chest discomfort. She was recommended taking a full strength aspirin. Despite counseling to the contrary she has been taking for most aspirin 2-3 times a day and is convinced that this would be better.  She has had a cardiology evaluation and apparently has had issues with tolerating multiple medications for blood pressure control.  Patient has not had a bone marrow examination as per outside records or reports. She has not been on hydroxyurea or other platelet lowering medications.  We did all the clonal studies for Essential thrombocytosis in July 2017 and she was noted to be neg for Jak2 V617F mutation, CALR mutation, Jak2 Exon 12 mutation and MPL mutation.  INTERVAL  HISTORY:  Patient is here to transfer care from [facility] for her chronic thrombocytosis and iron deficiency anemia. Her platelet counts have ranged from 490k to about 700k. She reports no new TIA symptoms. No chest pain. She reports she was taking oral iron and was causing a fair amount of constipation. Her ferritin level appears to have progressively improved. Labs today show no anemia. Her platelet counts are relatively stable at 567k. She has declined bone marrow biopsies. She currently declines the use of hydroxyurea. She continues to use spirin 325 mg 3 times daily despite recommendations to the contrary on multiple occasions. I recommended she discuss this with Dr. [name] and her neurologist.  We had discussed the possible use of IV iron pending labs being done today. She currently is not anemic and her hemoglobin is 14.9. Ferritin has progressively improved from 12-32 and today is up to 69. No overt GI bleeding or other blood loss.   REVIEW OF SYSTEMS:    10 Point review of systems of done and is negative except as noted above.  . Past Medical History:  Diagnosis Date  . Essential hypertension   . Hyperlipidemia   . Hypothyroidism   . Thrombocytosis ([place])     . Past Surgical History:  Procedure Laterality Date  . HERNIA REPAIR      . Social History  Substance Use Topics  . Smoking status: Never Smoker  . Smokeless tobacco: Never Used  . Alcohol use No    ALLERGIES:  is allergic to flaxseed (linseed); chlorthalidone; lavender oil; metoprolol; monascus purpureus went yeast; statins; typhoid vaccines;  atorvastatin; bio-flax; conjugated estrogens; ezetimibe; and simvastatin.  MEDICATIONS:  Current Outpatient Prescriptions  Medication Sig Dispense Refill  . aspirin 325 MG tablet Take 325 mg by mouth 3 (three) times daily.     . Azilsartan Medoxomil 40 MG TABS Take 20 mg by mouth daily. 45 tablet 1  . folic acid (FOLVITE) 284 MCG tablet Take by mouth.      . levothyroxine (SYNTHROID, LEVOTHROID) 150 MCG tablet Take 150 mcg by mouth as directed. Alternating with 166mg every other day    . multivitamin-lutein (OCUVITE-LUTEIN) CAPS capsule Take by mouth.    .Vladimir FasterGlycol-Propyl Glycol 0.4-0.3 % SOLN     . UNABLE TO FIND Med Name: Garlic take one pill daily    . vitamin B-12 (CYANOCOBALAMIN) 1000 MCG tablet Take by mouth.    . vitamin C (ASCORBIC ACID) 500 MG tablet Take by mouth.    . zinc gluconate 50 MG tablet Take by mouth.     No current facility-administered medications for this visit.     PHYSICAL EXAMINATION: ECOG PERFORMANCE STATUS: 1 - Symptomatic but completely ambulatory  . Vitals:   11/16/16 1113  BP: (!) 153/82  Pulse: 91  Resp: 18  Temp: 98 F (36.7 C)    Filed Weights   11/16/16 1113  Weight: 125 lb 6.4 oz (56.9 kg)   .Body mass index is 23.69 kg/m.  GENERAL:alert, in no acute distress and comfortable SKIN: no acute rashes, no significant lesions EYES: conjunctiva are pink and non-injected, sclera anicteric OROPHARYNX: MMM, no exudates, no oropharyngeal erythema or ulceration NECK: supple, no JVD LYMPH:  no palpable lymphadenopathy in the cervical, axillary or inguinal regions LUNGS: clear to auscultation b/l with normal respiratory effort HEART: regular rate & rhythm ABDOMEN:  normoactive bowel sounds , non tender, not distended, no palpable splenomegaly. Extremity: no pedal edema PSYCH: alert & oriented x 3 with fluent speech NEURO: no focal motor/sensory deficits  LABORATORY DATA:   I have reviewed the data as listed  . CBC Latest Ref Rng & Units 11/16/2016 09/16/2016 04/01/2016  WBC 3.9 - 10.3 10e3/uL 5.0 7.5 6.9  Hemoglobin 11.6 - 15.9 g/dL 14.9 14.3 13.4  Hematocrit 34.8 - 46.6 % 44.2 42.9 41.7  Platelets 145 - 400 10e3/uL 567(H) 524(H) 633(H)    . CMP Latest Ref Rng & Units 11/16/2016 09/16/2016 01/30/2016  Glucose 70 - 140 mg/dl 100 102(H) 105(H)  BUN 7.0 - 26.0 mg/dL 16.2 16 27(H)   Creatinine 0.6 - 1.1 mg/dL 0.9 0.73 0.90  Sodium 136 - 145 mEq/L 140 136 134(L)  Potassium 3.5 - 5.1 mEq/L 3.7 4.3 3.8  Chloride 101 - 111 mmol/L - 102 101  CO2 22 - 29 mEq/L '28 28 25  ' Calcium 8.4 - 10.4 mg/dL 9.3 8.9 8.4(L)  Total Protein 6.4 - 8.3 g/dL 7.2 7.0 7.0  Total Bilirubin 0.20 - 1.20 mg/dL 1.01 0.6 0.6  Alkaline Phos 40 - 150 U/L 116 98 116  AST 5 - 34 U/L '18 22 19  ' ALT 0 - 55 U/L '17 20 15     ' RADIOGRAPHIC STUDIES: I have personally reviewed the radiological images as listed and agreed with the findings in the report. No results found.  ASSESSMENT & PLAN:   83year old Caucasian female with  #1 Persistent thrombocytosis since 1998.  Plt counts have varied from 490-700k. No associated polycythemia or leukocytosis.  We did did testing to see if her thrombocytosis is clonal or reactive. And she has no evidence of clonality based on  below noted genetic testing. Jak2 V617F mutation was negative CalR mutation was negative  MPL mutation was negative Jak2 Exon12 mutation negative  All her molecular testing did not show definitive clonal genetic abnormalities to clearly define her condition as being essential thrombocytosis. There is still a possibility given the persistence of her thrombocytosis that she could have essential thrombocytosis. The only way to figure this out would be to get a bone marrow examination which she has repeatedly declined.  Plan -Would recommend Patient to continue aspirin 325 mg by mouth daily. -She was again counseled about avoiding excessive amount of aspirin use since this is counterproductive to its antiplatelet function. -Continue follow-up with her primary care physician and cardiologist. -We discussed and she continues to decline a bone marrow biopsy. -She has declined empiric use of hydroxyurea with the plan to try to suppress her platelets below 400k to try to reduce her cardiovascular and stroke risk.  #2 Iron deficiency  Anemia This seems to have resolved. Ferritin levels have progressive improved from 12 and are now up to 69. #3 Intolerance to PO --significant constipation. Plan -no overt need for IV iron at this time. -would recommend OTC liquid Iron polysaccharide (Novaferrum- 178m/5ml - available on amazon) -- 2.5 ml daily with food to target ferritin levels of >100 -stool softner for constipation as needed -if unable to tolerate PO iron and unwilling to take this -- would recommend increase intake of iron rich foods.  RTC with Dr KIrene Limboin 4 months with rpt labs Continue f/u with PCP  I spent 25 minutes counseling the patient face to face. The total time spent in the appointment was 30 minutes and more than 50% was on counseling and direct patient cares.    GSullivan LoneMD MEast DubuqueAAHIVMS SV[facility] SacramentoCSter[facility] LLCHematology/Oncology Physician [facility] (Office):       [phone](Work cell):  [phone](Fax):           [phone]

## 2016-11-16 NOTE — Telephone Encounter (Signed)
Left message with r/s appt time -

## 2016-11-16 NOTE — Telephone Encounter (Signed)
Gave patient AVS and calender per 4/24 los.  

## 2016-11-17 ENCOUNTER — Telehealth: Payer: Self-pay | Admitting: *Deleted

## 2016-11-17 LAB — SEDIMENTATION RATE: Sedimentation Rate-Westergren: 15 mm/hr (ref 0–40)

## 2016-11-17 LAB — FOLATE RBC
FOLATE, HEMOLYSATE: 504.9 ng/mL
HEMATOCRIT: 44.1 % (ref 34.0–46.6)

## 2016-11-17 LAB — VITAMIN B12

## 2016-11-17 NOTE — Telephone Encounter (Signed)
Per Dr. Irene Limbo, called pt to inform her iron infusion not indicated at this time. Instructed pt to take oral liquid iron polysaccharide (Novaferrum) 62.5mg  daily with stool softener as needed.  Instructed pt if unable to tolerate, to continue eating iron rich foods.  Pt verbalized understanding.

## 2016-11-22 LAB — MULTIPLE MYELOMA PANEL, SERUM
ALBUMIN/GLOB SERPL: 1.5 (ref 0.7–1.7)
Albumin SerPl Elph-Mcnc: 4.1 g/dL (ref 2.9–4.4)
Alpha 1: 0.2 g/dL (ref 0.0–0.4)
Alpha2 Glob SerPl Elph-Mcnc: 0.6 g/dL (ref 0.4–1.0)
B-Globulin SerPl Elph-Mcnc: 1.2 g/dL (ref 0.7–1.3)
Gamma Glob SerPl Elph-Mcnc: 0.8 g/dL (ref 0.4–1.8)
Globulin, Total: 2.8 g/dL (ref 2.2–3.9)
IGG (IMMUNOGLOBIN G), SERUM: 866 mg/dL (ref 700–1600)
IgA, Qn, Serum: 397 mg/dL (ref 64–422)
IgM, Qn, Serum: 83 mg/dL (ref 26–217)
Total Protein: 6.9 g/dL (ref 6.0–8.5)

## 2016-11-23 ENCOUNTER — Ambulatory Visit: Payer: Medicare Other

## 2016-11-30 ENCOUNTER — Ambulatory Visit: Payer: Medicare Other

## 2017-01-07 ENCOUNTER — Telehealth: Payer: Self-pay | Admitting: Hematology

## 2017-01-07 NOTE — Telephone Encounter (Signed)
Left a VM about appointment with a call back number for questions or concerns

## 2017-01-12 DIAGNOSIS — D473 Essential (hemorrhagic) thrombocythemia: Secondary | ICD-10-CM | POA: Diagnosis not present

## 2017-01-12 DIAGNOSIS — Z8673 Personal history of transient ischemic attack (TIA), and cerebral infarction without residual deficits: Secondary | ICD-10-CM | POA: Diagnosis not present

## 2017-01-12 DIAGNOSIS — Z8639 Personal history of other endocrine, nutritional and metabolic disease: Secondary | ICD-10-CM | POA: Diagnosis not present

## 2017-01-20 ENCOUNTER — Other Ambulatory Visit (HOSPITAL_BASED_OUTPATIENT_CLINIC_OR_DEPARTMENT_OTHER): Payer: Medicare Other

## 2017-01-20 ENCOUNTER — Encounter: Payer: Self-pay | Admitting: Hematology

## 2017-01-20 ENCOUNTER — Telehealth: Payer: Self-pay | Admitting: Hematology

## 2017-01-20 ENCOUNTER — Ambulatory Visit (HOSPITAL_BASED_OUTPATIENT_CLINIC_OR_DEPARTMENT_OTHER): Payer: Medicare Other | Admitting: Hematology

## 2017-01-20 VITALS — BP 183/87 | HR 68 | Temp 98.0°F | Resp 20 | Ht 61.0 in | Wt 128.6 lb

## 2017-01-20 DIAGNOSIS — D473 Essential (hemorrhagic) thrombocythemia: Secondary | ICD-10-CM

## 2017-01-20 DIAGNOSIS — Z7982 Long term (current) use of aspirin: Secondary | ICD-10-CM

## 2017-01-20 DIAGNOSIS — I10 Essential (primary) hypertension: Secondary | ICD-10-CM | POA: Diagnosis not present

## 2017-01-20 DIAGNOSIS — D508 Other iron deficiency anemias: Secondary | ICD-10-CM

## 2017-01-20 DIAGNOSIS — D471 Chronic myeloproliferative disease: Secondary | ICD-10-CM

## 2017-01-20 DIAGNOSIS — E611 Iron deficiency: Secondary | ICD-10-CM | POA: Diagnosis not present

## 2017-01-20 DIAGNOSIS — E039 Hypothyroidism, unspecified: Secondary | ICD-10-CM | POA: Diagnosis not present

## 2017-01-20 LAB — IRON AND TIBC
%SAT: 24 % (ref 21–57)
IRON: 77 ug/dL (ref 41–142)
TIBC: 320 ug/dL (ref 236–444)
UIBC: 243 ug/dL (ref 120–384)

## 2017-01-20 LAB — COMPREHENSIVE METABOLIC PANEL
ALT: 14 U/L (ref 0–55)
AST: 18 U/L (ref 5–34)
Albumin: 3.6 g/dL (ref 3.5–5.0)
Alkaline Phosphatase: 121 U/L (ref 40–150)
Anion Gap: 8 mEq/L (ref 3–11)
BUN: 20.4 mg/dL (ref 7.0–26.0)
CHLORIDE: 104 meq/L (ref 98–109)
CO2: 29 meq/L (ref 22–29)
Calcium: 9.4 mg/dL (ref 8.4–10.4)
Creatinine: 0.8 mg/dL (ref 0.6–1.1)
EGFR: 71 mL/min/{1.73_m2} — AB (ref >90)
GLUCOSE: 93 mg/dL (ref 70–140)
POTASSIUM: 5 meq/L (ref 3.5–5.1)
SODIUM: 141 meq/L (ref 136–145)
Total Bilirubin: 1.01 mg/dL (ref 0.20–1.20)
Total Protein: 6.8 g/dL (ref 6.4–8.3)

## 2017-01-20 LAB — CBC & DIFF AND RETIC
BASO%: 0.9 % (ref 0.0–2.0)
Basophils Absolute: 0.1 10*3/uL (ref 0.0–0.1)
EOS%: 3.4 % (ref 0.0–7.0)
Eosinophils Absolute: 0.2 10*3/uL (ref 0.0–0.5)
HCT: 42.9 % (ref 34.8–46.6)
HGB: 13.7 g/dL (ref 11.6–15.9)
Immature Retic Fract: 5.3 % (ref 1.60–10.00)
LYMPH%: 35.6 % (ref 14.0–49.7)
MCH: 31.7 pg (ref 25.1–34.0)
MCHC: 31.9 g/dL (ref 31.5–36.0)
MCV: 99.3 fL (ref 79.5–101.0)
MONO#: 0.5 10*3/uL (ref 0.1–0.9)
MONO%: 9.6 % (ref 0.0–14.0)
NEUT%: 50.5 % (ref 38.4–76.8)
NEUTROS ABS: 2.7 10*3/uL (ref 1.5–6.5)
Platelets: 546 10*3/uL — ABNORMAL HIGH (ref 145–400)
RBC: 4.32 10*6/uL (ref 3.70–5.45)
RDW: 13.7 % (ref 11.2–14.5)
Retic %: 1.17 % (ref 0.70–2.10)
Retic Ct Abs: 50.54 10*3/uL (ref 33.70–90.70)
WBC: 5.3 10*3/uL (ref 3.9–10.3)
lymph#: 1.9 10*3/uL (ref 0.9–3.3)

## 2017-01-20 LAB — FERRITIN: Ferritin: 68 ng/ml (ref 9–269)

## 2017-01-20 NOTE — Patient Instructions (Signed)
Thank you for choosing [facility] to provide your oncology and hematology care.  To afford each patient quality time with our providers, please arrive 30 minutes before your scheduled appointment time.  If you arrive late for your appointment, you may be asked to reschedule.  We strive to give you quality time with our providers, and arriving late affects you and other patients whose appointments are after yours.  If you are a no show for multiple scheduled visits, you may be dismissed from the clinic at the providers discretion.   Again, thank you for choosing [facility], our hope is that these requests will decrease the amount of time that you wait before being seen by our physicians.  ______________________________________________________________________ Should you have questions after your visit to the [facility], please contact our office at [phone] between the hours of 8:30 and 4:30 p.m.    Voicemails left after 4:30p.m will not be returned until the following business day.   For prescription refill requests, please have your pharmacy contact us directly.  Please also try to allow 48 hours for prescription requests.   Please contact the scheduling department for questions regarding scheduling.  For scheduling of procedures such as PET scans, CT scans, MRI, Ultrasound, etc please contact central scheduling at [phone].   Resources For Cancer Patients and Caregivers:  American Cancer Society:  800-227-2345  Can help patients locate various types of support and financial assistance Cancer Care: 1-800-813-HOPE (4673) Provides financial assistance, online support groups, medication/co-pay assistance.   [place] DSS:  336-641-3447 Where to apply for food stamps, Medicaid, and utility assistance Medicare Rights Center: 800-333-4114 Helps people with Medicare understand their rights and benefits, navigate the Medicare system, and secure the  quality healthcare they deserve SCAT: 336-333-6589  [employer]'s shared-ride transportation service for eligible riders who have a disability that prevents them from riding the fixed route bus.   For additional information on assistance programs please contact our social worker:   Grier Hock/Abigail Elmore:  [phone] 

## 2017-01-20 NOTE — Progress Notes (Signed)
Pt expressed some gradual changes in lightheadedness and increased unsteadiness at home. Educated pt to make slow changes from lying to sitting to standing. Additionally, for pt to take orthostatic BP and keep a log throughout the day if taking pressures in mornign, afternoon, evening, for cardiologist to be able to see comparison at appt in August. Pt and daughter verbalized understanding. Pt told me that she was not advised, but has been basing use of BP medication on morning BP only.

## 2017-01-20 NOTE — Telephone Encounter (Signed)
Appointments scheduled per 01/20/17 los. °Patient was given a copy of the AVS report and appointment schedule per 01/20/17 los. °

## 2017-01-24 NOTE — Progress Notes (Signed)
Marland Kitchen  HEMATOLOGY ONCOLOGY PROGRESS NOTE  Date of service: .01/20/2017  Patient Care Team: Celene Squibb, MD as PCP - General (Internal Medicine) Jac Canavan Arta Bruce, MD as Referring Physician (Internal Medicine)  CC: - f/u for chronic thrombocytosis ET vs reactive -Iron deficiency anemia  Diagnosis:   Chronic thrombocytosis-> ET vs reactive  HPI  Stacie Garza is a wonderful 83 y.o. female who has been referred to [place] by .[name], MD and Dr [name] for evaluation and management of thrombocytosis.  Patient has a history of hypertension, dyslipidemia hypothyroidism and had been following with Dr. [name] for elevated platelets which according to her outside records have been in the range of 490k to 640k since June 1998.  Last CBC available from 10/07/2015 showed a platelet count of 600k. Hemoglobin was within normal limits at 12.9 with an MCV of 93.8 and a WBC count of 6.2k.  Outside molecular studies to evaluate for essential thrombocytosis were not available but as per her outside notes she was previously noted to be Jak2 negative.  She apparently has been having symptoms suggestive of repeated TIAs described as intermittent unsteadiness on the feet and dizziness and also notes some intermittent chest discomfort. She was recommended taking a full strength aspirin. Despite counseling to the contrary she has been taking for most aspirin 2-3 times a day and is convinced that this would be better.  She has had a cardiology evaluation and apparently has had issues with tolerating multiple medications for blood pressure control.  Patient has not had a bone marrow examination as per outside records or reports. She has not been on hydroxyurea or other platelet lowering medications.  We did all the clonal studies for Essential thrombocytosis in July 2017 and she was noted to be neg for Jak2 V617F mutation, CALR mutation, Jak2 Exon 12 mutation and MPL mutation.  INTERVAL  HISTORY:  Patient is here for her scheduled 3 month f/u for thrombocytosis suspected ET (mutation neg).  She has had chronic thrombocytosis and iron deficiency anemia. Her platelet counts have ranged from 490k to about 700k. She reports no new TIA symptoms. No chest pain. Has reduced her ASA from 378mTID to 3275mpo daily as instructed. She has not been taking the PO iron as instructed.  Labs today show no anemia. Her platelet counts are relatively stable at 546k (down from 567k on last visit) She has declined bone marrow biopsies. She currently declines the use of hydroxyurea. We discussed this again and she was given a patient education hand out regarding this. -She notes that she will maintain compliance with the PO iron to try to achieve ferritin level of >100 to determine if some reactive element of the thrombocytosis due to iron deficiency might improve.  REVIEW OF SYSTEMS:    10 Point review of systems of done and is negative except as noted above.  . Past Medical History:  Diagnosis Date  . Essential hypertension   . Hyperlipidemia   . Hypothyroidism   . [place]    . Past Surgical History:  Procedure Laterality Date  . HERNIA REPAIR      . Social History  Substance Use Topics  . Smoking status: Never Smoker  . Smokeless tobacco: Never Used  . Alcohol use No    ALLERGIES:  is allergic to flaxseed (linseed); chlorthalidone; lavender oil; metoprolol; monascus purpureus went yeast; statins; typhoid vaccines; atorvastatin; bio-flax; conjugated estrogens; ezetimibe; and simvastatin.  MEDICATIONS:  Current Outpatient Prescriptions  Medication  Sig Dispense Refill  . aspirin 325 MG tablet Take 325 mg by mouth once.     . Azilsartan Medoxomil 40 MG TABS Take 20 mg by mouth daily. 45 tablet 1  . folic acid (FOLVITE) 638 MCG tablet Take by mouth.    . levothyroxine (SYNTHROID, LEVOTHROID) 150 MCG tablet Take 150 mcg by mouth as directed. Alternating with 194mg  every other day    . multivitamin-lutein (OCUVITE-LUTEIN) CAPS capsule Take by mouth.    .Vladimir FasterGlycol-Propyl Glycol 0.4-0.3 % SOLN     . UNABLE TO FIND Med Name: Garlic take one pill daily    . vitamin B-12 (CYANOCOBALAMIN) 1000 MCG tablet Take by mouth.    . vitamin C (ASCORBIC ACID) 500 MG tablet Take by mouth.    . zinc gluconate 50 MG tablet Take by mouth.     No current facility-administered medications for this visit.     PHYSICAL EXAMINATION: ECOG PERFORMANCE STATUS: 1 - Symptomatic but completely ambulatory  . Vitals:   01/20/17 1028  BP: (!) 183/87  Pulse: 68  Resp: 20  Temp: 98 F (36.7 C)    Filed Weights   01/20/17 1028  Weight: 128 lb 9.6 oz (58.3 kg)   .Body mass index is 24.3 kg/m.  GENERAL:alert, in no acute distress and comfortable SKIN: no acute rashes, no significant lesions EYES: conjunctiva are pink and non-injected, sclera anicteric OROPHARYNX: MMM, no exudates, no oropharyngeal erythema or ulceration NECK: supple, no JVD LYMPH:  no palpable lymphadenopathy in the cervical, axillary or inguinal regions LUNGS: clear to auscultation b/l with normal respiratory effort HEART: regular rate & rhythm ABDOMEN:  normoactive bowel sounds , non tender, not distended, no palpable splenomegaly. Extremity: no pedal edema PSYCH: alert & oriented x 3 with fluent speech NEURO: no focal motor/sensory deficits  LABORATORY DATA:   I have reviewed the data as listed  . CBC Latest Ref Rng & Units 01/20/2017 11/16/2016 11/16/2016  WBC 3.9 - 10.3 10e3/uL 5.3 5.0 -  Hemoglobin 11.6 - 15.9 g/dL 13.7 14.9 -  Hematocrit 34.8 - 46.6 % 42.9 44.2 44.1  Platelets 145 - 400 10e3/uL 546(H) 567(H) -    . CMP Latest Ref Rng & Units 01/20/2017 11/16/2016 11/16/2016  Glucose 70 - 140 mg/dl 93 100 -  BUN 7.0 - 26.0 mg/dL 20.4 16.2 -  Creatinine 0.6 - 1.1 mg/dL 0.8 0.9 -  Sodium 136 - 145 mEq/L 141 140 -  Potassium 3.5 - 5.1 mEq/L 5.0 3.7 -  Chloride 101 - 111 mmol/L - -  -  CO2 22 - 29 mEq/L 29 28 -  Calcium 8.4 - 10.4 mg/dL 9.4 9.3 -  Total Protein 6.4 - 8.3 g/dL 6.8 7.2 6.9  Total Bilirubin 0.20 - 1.20 mg/dL 1.01 1.01 -  Alkaline Phos 40 - 150 U/L 121 116 -  AST 5 - 34 U/L 18 18 -  ALT 0 - 55 U/L 14 17 -     RADIOGRAPHIC STUDIES: I have personally reviewed the radiological images as listed and agreed with the findings in the report. No results found.  ASSESSMENT & PLAN:   83year old Caucasian female with  #1 Persistent thrombocytosis since 1998. Platelet today are a little lower at 546k. No h/o VTE.  Plt counts have varied from 490-700k. No associated polycythemia or leukocytosis.  We did did testing to see if her thrombocytosis is clonal or reactive. And she has no evidence of clonality based on below noted genetic testing. Jak2 V617F mutation was  negative CalR mutation was negative  MPL mutation was negative Jak2 Exon12 mutation negative  All her molecular testing did not show definitive clonal genetic abnormalities to clearly define her condition as being essential thrombocytosis. There is still a possibility given the persistence of her thrombocytosis that she could have essential thrombocytosis. The only way to figure this out would be to get a bone marrow examination which she has repeatedly declined.  Plan -patient is now down to appropriate dose of aspirin 325 mg by mouth daily. -We discussed and she continues to decline a bone marrow biopsy. -She has declined empiric use of hydroxyurea with the plan to try to suppress her platelets below 400k to try to reduce her cardiovascular and stroke risk. She was given patient hand out about this medication and wants to think about it.  #2 Iron deficiency Anemia This seems to have resolved. Ferritin levels have progressive improved from 12 and are now up to 68. . Lab Results  Component Value Date   IRON 77 01/20/2017   TIBC 320 01/20/2017   IRONPCTSAT 24 01/20/2017   (Iron and  TIBC)  Lab Results  Component Value Date   FERRITIN 68 01/20/2017    #3 Intolerance to PO --significant constipation. Plan -no overt need for IV iron at this time. -would recommend OTC liquid Iron polysaccharide (Novaferrum- 179m/5ml - available on amazon) -- 2.5 ml daily with food to target ferritin levels of >100 (has not been taking this and was re-educated regarding this). -stool softner for constipation as needed -if unable to tolerate PO iron and unwilling to take this -- would recommend increase intake of iron rich foods.  RTC with Dr KIrene Limboin 4 months with rpt labs Continue f/u with PCP  I spent 20 minutes counseling the patient face to face. The total time spent in the appointment was 25 minutes and more than 50% was on counseling and direct patient cares.    GSullivan LoneMD MCoraAAHIVMS SLillian M. Hudspeth Memorial HospitalCEmory Johns Creek HospitalHematology/Oncology Physician CChrist[facility] (Office):       [phone](Work cell):  [phone](Fax):           [phone]

## 2017-01-25 ENCOUNTER — Other Ambulatory Visit: Payer: Medicare Other

## 2017-01-25 ENCOUNTER — Ambulatory Visit: Payer: Medicare Other | Admitting: Hematology

## 2017-02-02 ENCOUNTER — Ambulatory Visit: Payer: Medicare Other

## 2017-02-02 DIAGNOSIS — I779 Disorder of arteries and arterioles, unspecified: Secondary | ICD-10-CM

## 2017-02-02 DIAGNOSIS — I6523 Occlusion and stenosis of bilateral carotid arteries: Secondary | ICD-10-CM | POA: Diagnosis not present

## 2017-02-02 DIAGNOSIS — I739 Peripheral vascular disease, unspecified: Principal | ICD-10-CM

## 2017-02-03 LAB — VAS US CAROTID
LCCADDIAS: -24 cm/s
LCCAPDIAS: 0 cm/s
LCCAPSYS: 137 cm/s
LEFT ECA DIAS: -17 cm/s
LEFT VERTEBRAL DIAS: -15 cm/s
LICADDIAS: -16 cm/s
LICADSYS: -62 cm/s
LICAPDIAS: -30 cm/s
LICAPSYS: -154 cm/s
Left CCA dist sys: -122 cm/s
RIGHT ECA DIAS: -16 cm/s
RIGHT VERTEBRAL DIAS: 0 cm/s
Right CCA prox dias: 0 cm/s
Right CCA prox sys: 114 cm/s
Right cca dist sys: -130 cm/s

## 2017-02-07 ENCOUNTER — Telehealth: Payer: Self-pay | Admitting: *Deleted

## 2017-02-07 NOTE — Telephone Encounter (Signed)
Notes recorded by Laurine Blazer, LPN on 03/05/2547 at 6:28 PM EDT Patient notified. Copy to pmd. Follow up already scheduled for 02/14/2017.

## 2017-02-07 NOTE — Telephone Encounter (Signed)
-----   Message from Satira Sark, MD sent at 02/04/2017  9:07 AM EDT ----- Results reviewed. Heterogeneous plaque, bilaterally.1-39% ICA stenosis, bilaterally. These results actually look better than her prior assessment. Continue with current medications and follow-up plan. A copy of this test should be forwarded to Celene Squibb, MD.

## 2017-02-14 ENCOUNTER — Ambulatory Visit (INDEPENDENT_AMBULATORY_CARE_PROVIDER_SITE_OTHER): Payer: Medicare Other | Admitting: Cardiology

## 2017-02-14 ENCOUNTER — Encounter: Payer: Self-pay | Admitting: Cardiology

## 2017-02-14 VITALS — BP 156/78 | HR 76 | Ht 61.0 in | Wt 127.4 lb

## 2017-02-14 DIAGNOSIS — I779 Disorder of arteries and arterioles, unspecified: Secondary | ICD-10-CM | POA: Diagnosis not present

## 2017-02-14 DIAGNOSIS — I10 Essential (primary) hypertension: Secondary | ICD-10-CM | POA: Diagnosis not present

## 2017-02-14 DIAGNOSIS — I739 Peripheral vascular disease, unspecified: Principal | ICD-10-CM

## 2017-02-14 NOTE — Patient Instructions (Signed)
Medication Instructions:  Your physician recommends that you continue on your current medications as directed. Please refer to the Current Medication list given to you today.  Labwork: NONE  Testing/Procedures: Your physician has requested that you have a carotid duplex. This test is an ultrasound of the carotid arteries in your neck. It looks at blood flow through these arteries that supply the brain with blood. Allow one hour for this exam. There are no restrictions or special instructions.  Follow-Up: Your physician wants you to follow-up in: [place]. You will receive a reminder letter in the mail two months in advance. If you don't receive a letter, please call our office to schedule the follow-up appointment.  Any Other Special Instructions Will Be Listed Below (If Applicable).  If you need a refill on your cardiac medications before your next appointment, please call your pharmacy.

## 2017-02-14 NOTE — Progress Notes (Signed)
Cardiology Office Note  Date: 02/14/2017   ID: Stacie Garza, DOB [birth year], MRN [id number]  PCP: Celene Squibb, MD  Primary Cardiologist: Rozann Lesches, MD   Chief Complaint  Patient presents with  . Carotid artery disease    History of Present Illness: Stacie Garza is an 83 y.o. female last seen in January. She presents for a routine follow-up visit. Reports no focal motor weakness or speech deficits. She has had no palpitations or chest pain. She does describe a sensation of numbness in her feet and lower legs, sounds like neuropathy by description. She does not report leg pain or definite claudication.  Recent follow-up carotid Dopplers are outlined below. She had overall mild atherosclerosis, we discussed the results today. She continues on aspirin, has statin intolerance.  I personally reviewed her ECG today which shows normal sinus rhythm with left anterior fascicular block.  Past Medical History:  Diagnosis Date  . Essential hypertension   . Hyperlipidemia   . Hypothyroidism   . Thrombocytosis ([place])     Past Surgical History:  Procedure Laterality Date  . HERNIA REPAIR      Current Outpatient Prescriptions  Medication Sig Dispense Refill  . aspirin 325 MG tablet Take 325 mg by mouth once.     . Azilsartan Medoxomil 40 MG TABS Take 20 mg by mouth daily. 45 tablet 1  . Ferrous Gluconate-C-Folic Acid (IRON-C PO) Take 125 mg by mouth daily.    . folic acid (FOLVITE) 976 MCG tablet Take by mouth.    . levothyroxine (SYNTHROID, LEVOTHROID) 150 MCG tablet Take 150 mcg by mouth as directed. Alternating with 112mcg every other day    . multivitamin-lutein (OCUVITE-LUTEIN) CAPS capsule Take by mouth.    [name] Glycol-Propyl Glycol 0.4-0.3 % SOLN     . UNABLE TO FIND Med Name: Garlic take one pill daily    . vitamin B-12 (CYANOCOBALAMIN) 1000 MCG tablet Take by mouth.    . vitamin C (ASCORBIC ACID) 500 MG tablet Take by mouth.    . zinc gluconate 50 MG tablet Take  by mouth.     No current facility-administered medications for this visit.    Allergies:  Flaxseed (linseed); Chlorthalidone; Lavender oil; Metoprolol; Monascus purpureus went yeast; Statins; Typhoid vaccines; Atorvastatin; Bio-flax; Conjugated estrogens; Ezetimibe; and Simvastatin   Social History: The patient  reports that she has never smoked. She has never used smokeless tobacco. She reports that she does not drink alcohol or use drugs.   ROS:  Please see the history of present illness. Otherwise, complete review of systems is positive for none.  All other systems are reviewed and negative.   Physical Exam: VS:  BP (!) 156/78   Pulse 76   Ht 5\' 1"  (1.549 m)   Wt 127 lb 6.4 oz (57.8 kg)   BMI 24.07 kg/m , BMI Body mass index is 24.07 kg/m.  Wt Readings from Last 3 Encounters:  02/14/17 127 lb 6.4 oz (57.8 kg)  01/20/17 128 lb 9.6 oz (58.3 kg)  11/16/16 125 lb 6.4 oz (56.9 kg)    General: Patient appears comfortable at rest. HEENT: Conjunctiva and lids normal, oropharynx clear with moist mucosa. Neck: Supple, no elevated JVP, soft right carotid bruit, no thyromegaly. Lungs: Clear to auscultation, nonlabored breathing at rest. Cardiac: Regular rate and rhythm, no S3 or significant systolic murmur, no pericardial rub. Abdomen: Soft, nontender, bowel sounds present. Extremities: No pitting edema, distal pulses 2+. Skin: Warm and dry. Musculoskeletal: No  kyphosis. Neuropsychiatric: Alert and oriented x3, affect grossly appropriate.  ECG: I personally reviewed the tracing from 01/14/2016 which showed sinus rhythm with low voltage, poor R-wave progression, and left anterior fascicular block.  Recent Labwork: 01/20/2017: ALT 14; AST 18; BUN 20.4; Creatinine 0.8; HGB 13.7; Platelets 546; Potassium 5.0; Sodium 141   Other Studies Reviewed Today:  Lexiscan Cardiolite 04/01/2015 [facility]); No diagnostic ST segment changes, no evidence of scar or ischemia, LVEF 74%.  Carotid Doppler  02/02/2017: Heterogeneous plaque with bilateral 1-39% ICA stenoses.  Assessment and Plan:  1. Bilateral carotid artery disease, overall mild by recent follow-up carotid Dopplers. She continues on aspirin, has statin intolerance. She is asymptomatic.  2. Possible leg neuropathy symptoms. She had good pulses on examination. Recommend further discussion with PCP.  3. Essential hypertension, keep follow-up with Dr. [name].  Current medicines were reviewed with the patient today.   Orders Placed This Encounter  Procedures  . EKG 12-Lead    Disposition: Follow-up in one year with carotid Dopplers.  Signed, Satira Sark, MD, [facility] 02/14/2017 1:01 PM    [facility] [place], [place], [place] [address] Phone: [phone]; Fax: [phone]

## 2017-02-25 ENCOUNTER — Telehealth: Payer: Self-pay

## 2017-02-25 NOTE — Telephone Encounter (Signed)
Received message from pt regarding feet numbness that started in June. Dr. Irene Limbo recommended she discuss with her PCP. Passed message along to pt and she verbalized understanding.

## 2017-02-25 NOTE — Telephone Encounter (Signed)
Pt c/o feet being numb for last 2 weeks. No tingling. It is constant. No hand numbness. Changing position does not help. It affects her walking, she needs to wear supportive shoes or her balance is affected. No recent accidents. No medicine changes.

## 2017-03-02 DIAGNOSIS — Z808 Family history of malignant neoplasm of other organs or systems: Secondary | ICD-10-CM | POA: Diagnosis not present

## 2017-03-02 DIAGNOSIS — D485 Neoplasm of uncertain behavior of skin: Secondary | ICD-10-CM | POA: Diagnosis not present

## 2017-03-02 DIAGNOSIS — D225 Melanocytic nevi of trunk: Secondary | ICD-10-CM | POA: Diagnosis not present

## 2017-03-02 DIAGNOSIS — Z85828 Personal history of other malignant neoplasm of skin: Secondary | ICD-10-CM | POA: Diagnosis not present

## 2017-03-02 DIAGNOSIS — D0439 Carcinoma in situ of skin of other parts of face: Secondary | ICD-10-CM | POA: Diagnosis not present

## 2017-03-02 DIAGNOSIS — L821 Other seborrheic keratosis: Secondary | ICD-10-CM | POA: Diagnosis not present

## 2017-03-21 ENCOUNTER — Encounter: Payer: Self-pay | Admitting: Family Medicine

## 2017-03-21 ENCOUNTER — Ambulatory Visit (INDEPENDENT_AMBULATORY_CARE_PROVIDER_SITE_OTHER): Payer: Medicare Other | Admitting: Family Medicine

## 2017-03-21 VITALS — BP 137/81 | HR 90 | Temp 96.9°F | Ht 61.0 in | Wt 127.0 lb

## 2017-03-21 DIAGNOSIS — E7800 Pure hypercholesterolemia, unspecified: Secondary | ICD-10-CM | POA: Diagnosis not present

## 2017-03-21 DIAGNOSIS — G629 Polyneuropathy, unspecified: Secondary | ICD-10-CM

## 2017-03-21 DIAGNOSIS — E039 Hypothyroidism, unspecified: Secondary | ICD-10-CM | POA: Diagnosis not present

## 2017-03-21 DIAGNOSIS — D473 Essential (hemorrhagic) thrombocythemia: Secondary | ICD-10-CM

## 2017-03-21 NOTE — Progress Notes (Signed)
Subjective:  Patient ID: Stacie Garza, female    DOB: 11/05/1927  Age: 83 y.o. MRN: [id number]  CC: New Patient (Initial Visit) (pt here today to establish care and also has some numbness in both feet without tingling or burning this past month.)   HPI Stacie Garza presents As a new patient to establish herself for care in this practice. She also is here for  follow-up of hypertension. Patient has no history of headache chest pain or shortness of breath or recent cough. Patient also denies symptoms of TIA . Patient checks  blood pressure at home Twice every day and has not had any elevated readings recently.Systolic usually runs from 125-145 usually down below 135. Patient denies side effects from medication. States taking it regularly.  Patient reports that her feet are numb. She says she has sensation in them but they just feel like they're not there. She can walk on them. However, when she walks barefoot in her house on her carpet, it feels like there are marbles under her feet.It is equal in both feet it does not affect her upper limbs or her tongue. She's been taking some B12 for the last few days. The symptoms started about a month ago.  History [place] has a past medical history of Essential hypertension; Hyperlipidemia; Hypothyroidism; Thrombocytosis ([place]); and TIA (transient ischemic attack) (2004).   She has a past surgical history that includes Hernia repair and Abdominal hysterectomy.   Her family history includes Arthritis in her mother and sister; Cancer in her sister; Diabetes in her sister; Heart attack in her father; Heart disease in her brother and father.She reports that she has never smoked. She has never used smokeless tobacco. She reports that she does not drink alcohol or use drugs.  Current Outpatient Prescriptions on File Prior to Visit  Medication Sig Dispense Refill  . aspirin 325 MG tablet Take 325 mg by mouth once.     . Azilsartan Medoxomil 40 MG TABS Take 20 mg by  mouth daily. 45 tablet 1  . Ferrous Gluconate-C-Folic Acid (IRON-C PO) Take 125 mg by mouth daily.    . folic acid (FOLVITE) 017 MCG tablet Take by mouth.    . levothyroxine (SYNTHROID, LEVOTHROID) 150 MCG tablet Take 150 mcg by mouth as directed. Alternating with 141mg every other day    . multivitamin-lutein (OCUVITE-LUTEIN) CAPS capsule Take by mouth.    .Vladimir FasterGlycol-Propyl Glycol 0.4-0.3 % SOLN     . UNABLE TO FIND Med Name: Garlic take one pill daily    . vitamin B-12 (CYANOCOBALAMIN) 1000 MCG tablet Take by mouth.    . vitamin C (ASCORBIC ACID) 500 MG tablet Take by mouth.    . zinc gluconate 50 MG tablet Take by mouth.     No current facility-administered medications on file prior to visit.     ROS Review of Systems  Constitutional: Negative for activity change, appetite change and fever.  HENT: Negative for congestion, rhinorrhea and sore throat.   Eyes: Negative for visual disturbance.  Respiratory: Negative for cough and shortness of breath.   Cardiovascular: Negative for chest pain and palpitations.  Gastrointestinal: Negative for abdominal pain, diarrhea and nausea.  Genitourinary: Negative for dysuria.  Musculoskeletal: Negative for arthralgias and myalgias.  Neurological: Positive for numbness ( See history of present illness). Negative for dizziness, tremors, light-headedness and headaches.    Objective:  BP 137/81   Pulse 90   Temp (!) 96.9 F (36.1 C) (Oral)  Ht _0  (1.549 m)   Wt 127 lb (57.6 kg)   BMI 24.00 kg/m   BP Readings from Last 3 Encounters:  03/21/17 137/81  02/14/17 (!) 156/78  01/20/17 (!) 183/87    Wt Readings from Last 3 Encounters:  03/21/17 127 lb (57.6 kg)  02/14/17 127 lb 6.4 oz (57.8 kg)  01/20/17 128 lb 9.6 oz (58.3 kg)     Physical Exam  Constitutional: She is oriented to person, place, and time. She appears well-developed and well-nourished. No distress.  HENT:  Head: Normocephalic and atraumatic.  Right Ear:  External ear normal.  Left Ear: External ear normal.  Nose: Nose normal.  Mouth/Throat: Oropharynx is clear and moist.  Eyes: Pupils are equal, round, and reactive to light. Conjunctivae and EOM are normal.  Neck: Normal range of motion. Neck supple. No thyromegaly present.  Cardiovascular: Normal rate, regular rhythm and normal heart sounds.   No murmur heard. Pulmonary/Chest: Effort normal and breath sounds normal. No respiratory distress. She has no wheezes. She has no rales.  Abdominal: Soft. Bowel sounds are normal. She exhibits no distension. There is no tenderness.  Lymphadenopathy:    She has no cervical adenopathy.  Neurological: She is alert and oriented to person, place, and time. She has normal reflexes. She displays normal reflexes. No cranial nerve deficit. She exhibits normal muscle tone. Coordination normal.  Sensation is intact for light touch via monofilament for all dermatomes both feet  Skin: Skin is warm and dry.  Psychiatric: She has a normal mood and affect. Her behavior is normal. Judgment and thought content normal.        Assessment & Plan:   Saydi was seen today for new patient (initial visit).  Diagnoses and all orders for this visit:  Essential thrombocythemia ([place]) -     CBC with Differential/Platelet -     Sedimentation Rate -     Vitamin B12  Hypothyroidism, unspecified type -     TSH  HYPERCHOLESTEROLEMIA -     CMP14+EGFR -     Lipid panel  Neuropathy -     Vitamin B12   I am having Ms. Sedor maintain her zinc gluconate, aspirin, multivitamin-lutein, vitamin C, vitamin I-34, folic acid, Polyethyl Glycol-Propyl Glycol, UNABLE TO FIND, levothyroxine, Azilsartan Medoxomil, and Ferrous Gluconate-C-Folic Acid (IRON-C PO).  No orders of the defined types were placed in this encounter.    Follow-up: Return in about 6 weeks (around 05/02/2017).  Claretta Fraise, M.D.

## 2017-03-22 LAB — CMP14+EGFR
A/G RATIO: 1.5 (ref 1.2–2.2)
ALBUMIN: 4.1 g/dL (ref 3.5–4.7)
ALK PHOS: 132 IU/L — AB (ref 39–117)
ALT: 12 IU/L (ref 0–32)
AST: 20 IU/L (ref 0–40)
BILIRUBIN TOTAL: 0.5 mg/dL (ref 0.0–1.2)
BUN/Creatinine Ratio: 21 (ref 12–28)
BUN: 16 mg/dL (ref 8–27)
CO2: 26 mmol/L (ref 20–29)
Calcium: 9.6 mg/dL (ref 8.7–10.3)
Chloride: 97 mmol/L (ref 96–106)
Creatinine, Ser: 0.76 mg/dL (ref 0.57–1.00)
GFR calc non Af Amer: 71 mL/min/{1.73_m2} (ref >59)
GFR, EST AFRICAN AMERICAN: 82 mL/min/{1.73_m2} (ref >59)
GLOBULIN, TOTAL: 2.8 g/dL (ref 1.5–4.5)
Glucose: 96 mg/dL (ref 65–99)
Potassium: 4.9 mmol/L (ref 3.5–5.2)
Sodium: 139 mmol/L (ref 134–144)
TOTAL PROTEIN: 6.9 g/dL (ref 6.0–8.5)

## 2017-03-22 LAB — CBC WITH DIFFERENTIAL/PLATELET
BASOS ABS: 0 10*3/uL (ref 0.0–0.2)
BASOS: 1 %
EOS (ABSOLUTE): 0.2 10*3/uL (ref 0.0–0.4)
EOS: 2 %
HEMATOCRIT: 43 % (ref 34.0–46.6)
HEMOGLOBIN: 14.7 g/dL (ref 11.1–15.9)
Immature Grans (Abs): 0 10*3/uL (ref 0.0–0.1)
Immature Granulocytes: 0 %
LYMPHS ABS: 2.1 10*3/uL (ref 0.7–3.1)
Lymphs: 26 %
MCH: 32.2 pg (ref 26.6–33.0)
MCHC: 34.2 g/dL (ref 31.5–35.7)
MCV: 94 fL (ref 79–97)
MONOCYTES: 14 %
Monocytes Absolute: 1.1 10*3/uL — ABNORMAL HIGH (ref 0.1–0.9)
NEUTROS ABS: 4.9 10*3/uL (ref 1.4–7.0)
Neutrophils: 57 %
Platelets: 678 10*3/uL — ABNORMAL HIGH (ref 150–379)
RBC: 4.57 x10E6/uL (ref 3.77–5.28)
RDW: 13.4 % (ref 12.3–15.4)
WBC: 8.4 10*3/uL (ref 3.4–10.8)

## 2017-03-22 LAB — LIPID PANEL
CHOL/HDL RATIO: 5.3 ratio — AB (ref 0.0–4.4)
CHOLESTEROL TOTAL: 227 mg/dL — AB (ref 100–199)
HDL: 43 mg/dL (ref >39)
LDL CALC: 142 mg/dL — AB (ref 0–99)
TRIGLYCERIDES: 211 mg/dL — AB (ref 0–149)
VLDL CHOLESTEROL CAL: 42 mg/dL — AB (ref 5–40)

## 2017-03-22 LAB — TSH: TSH: 0.08 u[IU]/mL — ABNORMAL LOW (ref 0.450–4.500)

## 2017-03-22 LAB — VITAMIN B12: Vitamin B-12: >2000 pg/mL — ABNORMAL HIGH (ref 232–1245)

## 2017-03-22 LAB — SEDIMENTATION RATE: Sed Rate: 8 mm/hr (ref 0–40)

## 2017-03-23 ENCOUNTER — Other Ambulatory Visit: Payer: Self-pay

## 2017-03-23 DIAGNOSIS — R7989 Other specified abnormal findings of blood chemistry: Secondary | ICD-10-CM

## 2017-03-25 ENCOUNTER — Telehealth: Payer: Self-pay | Admitting: Family Medicine

## 2017-03-25 LAB — THYROID PANEL WITH TSH
FREE THYROXINE INDEX: 2.8 (ref 1.2–4.9)
T3 UPTAKE RATIO: 31 % (ref 24–39)
T4, Total: 8.9 ug/dL (ref 4.5–12.0)
TSH: 0.079 u[IU]/mL — AB (ref 0.450–4.500)

## 2017-03-25 LAB — SPECIMEN STATUS REPORT

## 2017-03-25 NOTE — Telephone Encounter (Signed)
lmtcb but will close encounter as pt has encounter in result notes.

## 2017-03-25 NOTE — Telephone Encounter (Signed)
Pt requesting lab results Please review and advise

## 2017-03-25 NOTE — Telephone Encounter (Signed)
Labs have been reviewed.

## 2017-04-20 ENCOUNTER — Encounter: Payer: Self-pay | Admitting: Family Medicine

## 2017-04-20 ENCOUNTER — Ambulatory Visit (INDEPENDENT_AMBULATORY_CARE_PROVIDER_SITE_OTHER): Payer: Medicare Other | Admitting: Family Medicine

## 2017-04-20 VITALS — BP 148/88 | HR 83 | Temp 97.1°F | Ht 61.0 in | Wt 126.0 lb

## 2017-04-20 DIAGNOSIS — R202 Paresthesia of skin: Secondary | ICD-10-CM

## 2017-04-20 DIAGNOSIS — R002 Palpitations: Secondary | ICD-10-CM

## 2017-04-20 DIAGNOSIS — I10 Essential (primary) hypertension: Secondary | ICD-10-CM | POA: Diagnosis not present

## 2017-04-20 NOTE — Progress Notes (Signed)
Subjective:  Patient ID: Stacie Garza, female    DOB: 1928/01/20  Age: 83 y.o. MRN: [id number]  CC: Follow-up (pt here today for 1 month follow up of numbness in both feet and now she is having numbness in her left arm)   HPI Stacie Garza presents for Increasing numbness in her feet. It's hard to walk because she feels like her feet are there. Her feet still have the sensation of marbles on the bottom of them. She is also noticing more numbness in the right hand. When she touches her face on occasion and feels like sandpaper to her. She discontinued the B12 as soon as we found that her level was so high last month. No improvement as yet. In fact symptoms may if even continued to worsen. She notes that her high blood pressure today is unusual for her. It's not usually that high. Her cardiologist is manage this in the past and she would like to try someone new. She relates that her dentist did a panorama view  and saw what looked like some plaques. He sent a report to her cardiologist to never replied to her so she wants to switch cardiologists.  Depression screen [facility] 2/9 04/20/2017 03/21/2017  Decreased Interest 0 0  Down, Depressed, Hopeless 0 0  PHQ - 2 Score 0 0    History [place] has a past medical history of Essential hypertension; Hyperlipidemia; Hypothyroidism; Thrombocytosis ([place]); and TIA (transient ischemic attack) (2004).   She has a past surgical history that includes Hernia repair and Abdominal hysterectomy.   Her family history includes Arthritis in her mother and sister; Cancer in her sister; Diabetes in her sister; Heart attack in her father; Heart disease in her brother and father.She reports that she has never smoked. She has never used smokeless tobacco. She reports that she does not drink alcohol or use drugs.    ROS Review of Systems  Constitutional: Negative for activity change, appetite change and fever.  HENT: Negative for congestion, rhinorrhea and sore throat.     Eyes: Negative for visual disturbance.  Respiratory: Negative for cough and shortness of breath.   Cardiovascular: Negative for chest pain and palpitations.  Gastrointestinal: Negative for abdominal pain, diarrhea and nausea.  Genitourinary: Negative for dysuria.  Musculoskeletal: Negative for arthralgias and myalgias.  Neurological: Positive for weakness and numbness. Negative for dizziness, tremors (generalized, nonfocal) and light-headedness.    Objective:  BP (!) 148/88   Pulse 83   Temp (!) 97.1 F (36.2 C) (Oral)   Ht 5\' 1"  (1.549 m)   Wt 126 lb (57.2 kg)   BMI 23.81 kg/m   BP Readings from Last 3 Encounters:  04/20/17 (!) 148/88  03/21/17 137/81  02/14/17 (!) 156/78    Wt Readings from Last 3 Encounters:  04/20/17 126 lb (57.2 kg)  03/21/17 127 lb (57.6 kg)  02/14/17 127 lb 6.4 oz (57.8 kg)     Physical Exam  Constitutional: She is oriented to person, place, and time. She appears well-developed and well-nourished. No distress.  HENT:  Head: Normocephalic and atraumatic.  Right Ear: External ear normal.  Left Ear: External ear normal.  Nose: Nose normal.  Mouth/Throat: Oropharynx is clear and moist.  Eyes: Pupils are equal, round, and reactive to light. Conjunctivae and EOM are normal.  Neck: Normal range of motion. Neck supple. No thyromegaly present.  Cardiovascular: Normal rate, regular rhythm and normal heart sounds.   No murmur heard. Pulmonary/Chest: Effort normal and breath sounds  normal. No respiratory distress. She has no wheezes. She has no rales.  Abdominal: Soft. Bowel sounds are normal. She exhibits no distension. There is no tenderness.  Musculoskeletal: Normal range of motion.  Lymphadenopathy:    She has no cervical adenopathy.  Neurological: She is alert and oriented to person, place, and time. She has normal reflexes. No cranial nerve deficit. She exhibits abnormal muscle tone (decreased symmetrically BUE &BLE). Coordination normal.  Skin:  Skin is warm and dry.  Psychiatric: She has a normal mood and affect. Her behavior is normal. Thought content normal.      Assessment & Plan:   [place] was seen today for follow-up.  Diagnoses and all orders for this visit:  Paresthesia -     Vitamin B12 -     Sedimentation Rate -     Folate -     Ambulatory referral to Neurology  Palpitations -     Ambulatory referral to Cardiology  Essential hypertension -     Ambulatory referral to Cardiology       I have discontinued Ms. Locher's vitamin B-12. I am also having her maintain her zinc gluconate, aspirin, multivitamin-lutein, vitamin C, folic acid, Polyethyl Glycol-Propyl Glycol, UNABLE TO FIND, levothyroxine, Azilsartan Medoxomil, and Ferrous Gluconate-C-Folic Acid (IRON-C PO).  Allergies as of 04/20/2017      Reactions   Flaxseed (linseed) Itching, Other (See Comments), Rash   Blood in stool   Chlorthalidone Other (See Comments)   Dry eye (Eyelid stuck to eyeball)   Lavender Oil Other (See Comments)   Head felt dizzy and foggy, left arm pain, fatigue, off balance   Metoprolol Other (See Comments)   Headache,  Insomnia, Unstable gait, Lethargy, intermittent SOB.  (Last dose 07/24/2014)   Monascus Purpureus Went Yeast Other (See Comments), Nausea Only   Off balance, head felt heavy, aching in shoulders, fatigue, tightness in chest   Statins Other (See Comments)   Pain in back of head and neck, UTI, back pain   Typhoid Vaccines Other (See Comments)   redness   Atorvastatin    Bio-flax    Conjugated Estrogens    Ezetimibe    Simvastatin       Medication List       Accurate as of 04/20/17  5:46 PM. Always use your most recent med list.          aspirin 325 MG tablet Take 325 mg by mouth once.   Azilsartan Medoxomil 40 MG Tabs Take 20 mg by mouth daily.   folic acid 937 MCG tablet Commonly known as:  FOLVITE Take by mouth.   IRON-C PO Take 125 mg by mouth daily.   levothyroxine 150 MCG tablet Commonly  known as:  SYNTHROID, LEVOTHROID Take 150 mcg by mouth as directed. Alternating with 154mcg every other day   multivitamin-lutein Caps capsule Take by mouth.   Polyethyl Glycol-Propyl Glycol 0.4-0.3 % Soln   UNABLE TO FIND Med Name: Garlic take one pill daily   vitamin C 500 MG tablet Commonly known as:  ASCORBIC ACID Take by mouth.   zinc gluconate 50 MG tablet Take by mouth.            Discharge Care Instructions        Start     Ordered   04/20/17 0000  Vitamin B12     04/20/17 1548   04/20/17 0000  Sedimentation Rate     04/20/17 1548   04/20/17 0000  Folate     04/20/17  1548   04/20/17 0000  Ambulatory referral to Neurology    Comments:  ASAP   04/20/17 1548   04/20/17 0000  Ambulatory referral to Cardiology     04/20/17 1548       Follow-up: No Follow-up on file.  Claretta Fraise, M.D.

## 2017-04-21 LAB — FOLATE

## 2017-04-21 LAB — SEDIMENTATION RATE: Sed Rate: 5 mm/hr (ref 0–40)

## 2017-04-21 LAB — VITAMIN B12: VITAMIN B 12: 843 pg/mL (ref 232–1245)

## 2017-04-26 ENCOUNTER — Telehealth: Payer: Self-pay | Admitting: Family Medicine

## 2017-04-26 NOTE — Telephone Encounter (Signed)
Pt wants to change cardiologists She does not want to see Dr Domenic Polite Referral entered in [place]

## 2017-04-27 NOTE — Telephone Encounter (Signed)
This pt would like to see Dr. Percival Spanish in [place] going forward. Her next appointment is not until July 2019. She is scheduled for a doppler study and I explained to her this study would have to be done in [place] and she is fine with this. Can you assist me with this,please?

## 2017-05-06 ENCOUNTER — Ambulatory Visit (INDEPENDENT_AMBULATORY_CARE_PROVIDER_SITE_OTHER): Payer: Medicare Other | Admitting: Family Medicine

## 2017-05-06 ENCOUNTER — Encounter: Payer: Self-pay | Admitting: Family Medicine

## 2017-05-06 VITALS — BP 139/81 | HR 86 | Temp 96.6°F | Ht 61.0 in | Wt 126.0 lb

## 2017-05-06 DIAGNOSIS — E7800 Pure hypercholesterolemia, unspecified: Secondary | ICD-10-CM | POA: Diagnosis not present

## 2017-05-06 DIAGNOSIS — E039 Hypothyroidism, unspecified: Secondary | ICD-10-CM | POA: Diagnosis not present

## 2017-05-06 DIAGNOSIS — R42 Dizziness and giddiness: Secondary | ICD-10-CM

## 2017-05-06 MED ORDER — LOSARTAN POTASSIUM 25 MG PO TABS: 25.0000 mg | ORAL_TABLET | Freq: Every day | ORAL | 1 refills | Status: DC

## 2017-05-06 NOTE — Progress Notes (Signed)
Subjective:  Patient ID: Stacie Garza, female    DOB: [birth year]  Age: 83 y.o. MRN: [id number]  CC: Dizziness (pt here today c/o being dizzy especially when moving her head and bending over to pick something up and stands back up.)   HPI [facility] presents for Lightheadedness that occurs when she is walking. This is not occurring with when she is sitting whether she is moving her head or not. It does not go along with first standing up or bending over, but with any movement while standing. She fell twice in the last two days. Denies injury. Using a walker. Pt. Taking BP med 1/2 only. Then taking only on days when systolic is > 916 mmHg. No syncope associated with sx. The symptom is described as a clumsy foggy feeling that makes her unsteady on her feet. It is associated with a low-grade headache. She has never really had headaches before. Additionally it's been increasing over the last couple months but especially over the last month.  Depression screen [facility] 2/9 04/20/2017 03/21/2017  Decreased Interest 0 0  Down, Depressed, Hopeless 0 0  PHQ - 2 Score 0 0    History Stacie Garza has a past medical history of Essential hypertension; Hyperlipidemia; Hypothyroidism; Thrombocytosis ([place]); and TIA (transient ischemic attack) (2004).   She has a past surgical history that includes Hernia repair and Abdominal hysterectomy.   Her family history includes Arthritis in her mother and sister; Cancer in her sister; Diabetes in her sister; Heart attack in her father; Heart disease in her brother and father.She reports that she has never smoked. She has never used smokeless tobacco. She reports that she does not drink alcohol or use drugs.    ROS Review of Systems  Constitutional: Negative for activity change, appetite change and fever.  HENT: Negative for congestion, rhinorrhea and sore throat.   Eyes: Negative for visual disturbance.  Respiratory: Negative for cough and shortness of breath.     Cardiovascular: Negative for chest pain and palpitations.  Gastrointestinal: Negative for abdominal pain, diarrhea and nausea.  Genitourinary: Negative for dysuria.  Musculoskeletal: Negative for arthralgias and myalgias.  Neurological: Positive for light-headedness and headaches.    Objective:  BP 139/81   Pulse 86   Temp (!) 96.6 F (35.9 C) (Oral)   Ht '5\' 1"'  (1.549 m)   Wt 126 lb (57.2 kg)   BMI 23.81 kg/m   BP Readings from Last 3 Encounters:  05/06/17 139/81  04/20/17 (!) 148/88  03/21/17 137/81    Wt Readings from Last 3 Encounters:  05/06/17 126 lb (57.2 kg)  04/20/17 126 lb (57.2 kg)  03/21/17 127 lb (57.6 kg)     Physical Exam  Constitutional: She is oriented to person, place, and time. She appears well-developed and well-nourished. No distress.  HENT:  Head: Normocephalic and atraumatic.  Eyes: Pupils are equal, round, and reactive to light. Conjunctivae are normal.  Neck: Normal range of motion. Neck supple. No thyromegaly present.  Cardiovascular: Normal rate, regular rhythm and normal heart sounds.   No murmur heard. Pulmonary/Chest: Effort normal and breath sounds normal. No respiratory distress. She has no wheezes. She has no rales.  Abdominal: Soft. Bowel sounds are normal. She exhibits no distension. There is no tenderness.  Musculoskeletal: Normal range of motion.  Lymphadenopathy:    She has no cervical adenopathy.  Neurological: She is alert and oriented to person, place, and time.  Skin: Skin is warm and dry.  Psychiatric: She has a  normal mood and affect. Her behavior is normal. Judgment and thought content normal.      Assessment & Plan:   Stacie Garza was seen today for dizziness.  Diagnoses and all orders for this visit:  Hypothyroidism, unspecified type -     TSH -     T4, Free  Dizziness and giddiness -     CBC with Differential/Platelet -     CMP14+EGFR -     Lipid panel -     TSH -     T4, Free  HYPERCHOLESTEROLEMIA -      Lipid panel  Other orders -     losartan (COZAAR) 25 MG tablet; Take 1 tablet (25 mg total) by mouth daily.       I have discontinued Stacie Garza's folic acid and Azilsartan Medoxomil. I am also having her start on losartan. Additionally, I am having her maintain her zinc gluconate, aspirin, multivitamin-lutein, vitamin C, Polyethyl Glycol-Propyl Glycol, UNABLE TO FIND, levothyroxine, and Ferrous Gluconate-C-Folic Acid (IRON-C PO).  Allergies as of 05/06/2017      Reactions   Flaxseed (linseed) Itching, Other (See Comments), Rash   Blood in stool   Chlorthalidone Other (See Comments)   Dry eye (Eyelid stuck to eyeball)   Lavender Oil Other (See Comments)   Head felt dizzy and foggy, left arm pain, fatigue, off balance   Metoprolol Other (See Comments)   Headache,  Insomnia, Unstable gait, Lethargy, intermittent SOB.  (Last dose 07/24/2014)   Monascus Purpureus Went Yeast Other (See Comments), Nausea Only   Off balance, head felt heavy, aching in shoulders, fatigue, tightness in chest   Statins Other (See Comments)   Pain in back of head and neck, UTI, back pain   Typhoid Vaccines Other (See Comments)   redness   Atorvastatin    Bio-flax    Conjugated Estrogens    Ezetimibe    Simvastatin       Medication List       Accurate as of 05/06/17  4:55 PM. Always use your most recent med list.          aspirin 325 MG tablet Take 325 mg by mouth once.   IRON-C PO Take 125 mg by mouth daily.   levothyroxine 150 MCG tablet Commonly known as:  SYNTHROID, LEVOTHROID Take 150 mcg by mouth as directed. Alternating with 192mg every other day   losartan 25 MG tablet Commonly known as:  COZAAR Take 1 tablet (25 mg total) by mouth daily.   multivitamin-lutein Caps capsule Take by mouth.   Polyethyl Glycol-Propyl Glycol 0.4-0.3 % Soln   UNABLE TO FIND Med Name: Garlic take one pill daily   vitamin C 500 MG tablet Commonly known as:  ASCORBIC ACID Take by mouth.   zinc  gluconate 50 MG tablet Take by mouth.        Follow-up: Return in about 1 month (around 06/06/2017).  WClaretta Fraise M.D.

## 2017-05-08 LAB — CMP14+EGFR
A/G RATIO: 1.8 (ref 1.2–2.2)
ALT: 14 IU/L (ref 0–32)
AST: 22 IU/L (ref 0–40)
Albumin: 4.1 g/dL (ref 3.5–4.7)
Alkaline Phosphatase: 130 IU/L — ABNORMAL HIGH (ref 39–117)
BUN/Creatinine Ratio: 23 (ref 12–28)
BUN: 17 mg/dL (ref 8–27)
Bilirubin Total: 0.9 mg/dL (ref 0.0–1.2)
CHLORIDE: 96 mmol/L (ref 96–106)
CO2: 19 mmol/L — ABNORMAL LOW (ref 20–29)
Calcium: 8.8 mg/dL (ref 8.7–10.3)
Creatinine, Ser: 0.75 mg/dL (ref 0.57–1.00)
GFR calc Af Amer: 83 mL/min/{1.73_m2} (ref >59)
GFR calc non Af Amer: 72 mL/min/{1.73_m2} (ref >59)
GLOBULIN, TOTAL: 2.3 g/dL (ref 1.5–4.5)
Glucose: 87 mg/dL (ref 65–99)
Potassium: 4.6 mmol/L (ref 3.5–5.2)
SODIUM: 135 mmol/L (ref 134–144)
Total Protein: 6.4 g/dL (ref 6.0–8.5)

## 2017-05-08 LAB — CBC WITH DIFFERENTIAL/PLATELET
BASOS: 0 %
Basophils Absolute: 0 10*3/uL (ref 0.0–0.2)
EOS (ABSOLUTE): 0.2 10*3/uL (ref 0.0–0.4)
EOS: 3 %
HEMATOCRIT: 40.9 % (ref 34.0–46.6)
Hemoglobin: 14.2 g/dL (ref 11.1–15.9)
IMMATURE GRANULOCYTES: 0 %
Immature Grans (Abs): 0 10*3/uL (ref 0.0–0.1)
LYMPHS ABS: 2 10*3/uL (ref 0.7–3.1)
Lymphs: 30 %
MCH: 32.1 pg (ref 26.6–33.0)
MCHC: 34.7 g/dL (ref 31.5–35.7)
MCV: 93 fL (ref 79–97)
MONOS ABS: 0.9 10*3/uL (ref 0.1–0.9)
Monocytes: 13 %
NEUTROS PCT: 54 %
Neutrophils Absolute: 3.6 10*3/uL (ref 1.4–7.0)
PLATELETS: 580 10*3/uL — AB (ref 150–379)
RBC: 4.42 x10E6/uL (ref 3.77–5.28)
RDW: 13.6 % (ref 12.3–15.4)
WBC: 6.7 10*3/uL (ref 3.4–10.8)

## 2017-05-08 LAB — LIPID PANEL
CHOLESTEROL TOTAL: 206 mg/dL — AB (ref 100–199)
Chol/HDL Ratio: 4.8 ratio — ABNORMAL HIGH (ref 0.0–4.4)
HDL: 43 mg/dL (ref >39)
LDL Calculated: 134 mg/dL — ABNORMAL HIGH (ref 0–99)
TRIGLYCERIDES: 145 mg/dL (ref 0–149)
VLDL Cholesterol Cal: 29 mg/dL (ref 5–40)

## 2017-05-08 LAB — TSH: TSH: 0.064 u[IU]/mL — ABNORMAL LOW (ref 0.450–4.500)

## 2017-05-08 LAB — T4, FREE: FREE T4: 1.77 ng/dL (ref 0.82–1.77)

## 2017-05-09 ENCOUNTER — Telehealth: Payer: Self-pay | Admitting: Family Medicine

## 2017-05-09 ENCOUNTER — Other Ambulatory Visit: Payer: Self-pay | Admitting: Family Medicine

## 2017-05-09 MED ORDER — LEVOTHYROXINE SODIUM 150 MCG PO TABS: 150.0000 ug | ORAL_TABLET | ORAL | 2 refills | Status: DC

## 2017-05-10 NOTE — Telephone Encounter (Signed)
Mailed last labs to pt

## 2017-05-18 ENCOUNTER — Ambulatory Visit (INDEPENDENT_AMBULATORY_CARE_PROVIDER_SITE_OTHER): Payer: Medicare Other

## 2017-05-18 DIAGNOSIS — Z23 Encounter for immunization: Secondary | ICD-10-CM | POA: Diagnosis not present

## 2017-05-23 ENCOUNTER — Ambulatory Visit: Payer: Medicare Other | Admitting: Family Medicine

## 2017-05-23 NOTE — Progress Notes (Signed)
HEMATOLOGY ONCOLOGY PROGRESS NOTE  Date of service: 05/25/17   Patient Care Team: Claretta Fraise, MD as PCP - General (Family Medicine) Tenny Craw, MD as Referring Physician (Internal Medicine)  CC: - f/u for chronic thrombocytosis ET vs reactive -Iron deficiency anemia  Diagnosis:   Chronic thrombocytosis-> ET vs reactive  HPI  Stacie Garza is a wonderful 83 y.o. female who has been referred to [place] by .[name], MD and Dr [name] for evaluation and management of thrombocytosis.  Patient has a history of hypertension, dyslipidemia hypothyroidism and had been following with Dr. [name] for elevated platelets which according to her outside records have been in the range of 490k to 640k since June 1998.  Last CBC available from 10/07/2015 showed a platelet count of 600k. Hemoglobin was within normal limits at 12.9 with an MCV of 93.8 and a WBC count of 6.2k.  Outside molecular studies to evaluate for essential thrombocytosis were not available but as per her outside notes she was previously noted to be Jak2 negative.  She apparently has been having symptoms suggestive of repeated TIAs described as intermittent unsteadiness on the feet and dizziness and also notes some intermittent chest discomfort. She was recommended taking a full strength aspirin. Despite counseling to the contrary she has been taking for most aspirin 2-3 times a day and is convinced that this would be better.  She has had a cardiology evaluation and apparently has had issues with tolerating multiple medications for blood pressure control.  Patient has not had a bone marrow examination as per outside records or reports. She has not been on hydroxyurea or other platelet lowering medications.  We did all the clonal studies for Essential thrombocytosis in July 2017 and she was noted to be neg for Jak2 V617F mutation, CALR mutation, Jak2 Exon 12 mutation and MPL mutation.  INTERVAL  HISTORY:  Patient is here for her scheduled 3 month f/u for thrombocytosis suspected ET (mutation neg). Her platelet count as of 05/25/2017 is 556k. She reports she is doing well overall, She reports that she is seeing a neurologist Dr. Tasia Catchings today due to numbness to her b/l ankles and feet and left upper extremity. This is causing her to lose balance therefore is now aided by a walker to get around. She first noticed this in June of this year and it gradually intensified. She received the flu vaccine on week prior but reports the symptoms were present before the shot. She is taking full dose ASA twice daily despite counseling about taking it only once a day as an antiplatelet therapy.  On review of systems, pt reports numbness to the feet and ankles, and denies HA, fever, chills, and any other accompanying symptoms.    REVIEW OF SYSTEMS:    10 Point review of systems of done and is negative except as noted above.  . Past Medical History:  Diagnosis Date  . Essential hypertension   . Hyperlipidemia   . Hypothyroidism   . Thrombocytosis ([place])   . TIA (transient ischemic attack) 2004    . Past Surgical History:  Procedure Laterality Date  . ABDOMINAL HYSTERECTOMY    . HERNIA REPAIR      . Social History  Substance Use Topics  . Smoking status: Never Smoker  . Smokeless tobacco: Never Used  . Alcohol use No    ALLERGIES:  is allergic to flaxseed (linseed); chlorthalidone; lavender oil; metoprolol; monascus purpureus went yeast; statins; typhoid vaccines; atorvastatin; bio-flax; conjugated estrogens;  ezetimibe; and simvastatin.  MEDICATIONS:  Current Outpatient Prescriptions  Medication Sig Dispense Refill  . aspirin 325 MG tablet Take 325 mg by mouth once.     . Ferrous Gluconate-C-Folic Acid (IRON-C PO) Take 125 mg by mouth daily.    Marland Kitchen levothyroxine (SYNTHROID, LEVOTHROID) 150 MCG tablet Take 1 tablet (150 mcg total) by mouth as directed. 30 tablet 2  . losartan (COZAAR) 25 MG  tablet Take 1 tablet (25 mg total) by mouth daily. 30 tablet 1  . multivitamin-lutein (OCUVITE-LUTEIN) CAPS capsule Take by mouth.    [name] Glycol-Propyl Glycol 0.4-0.3 % SOLN     . UNABLE TO FIND Med Name: Garlic take one pill daily    . vitamin C (ASCORBIC ACID) 500 MG tablet Take by mouth.    . zinc gluconate 50 MG tablet Take by mouth.     No current facility-administered medications for this visit.     PHYSICAL EXAMINATION: ECOG PERFORMANCE STATUS: 1 - Symptomatic but completely ambulatory  . Vitals:   05/25/17 0947  BP: (!) 184/99  Pulse: 75  Resp: 18  Temp: (!) 97.4 F (36.3 C)  SpO2: 94%    Filed Weights   05/25/17 0947  Weight: 127 lb (57.6 kg)   .Body mass index is 24 kg/m.  GENERAL:alert, in no acute distress and comfortable SKIN: no acute rashes, no significant lesions EYES: conjunctiva are pink and non-injected, sclera anicteric OROPHARYNX: MMM, no exudates, no oropharyngeal erythema or ulceration NECK: supple, no JVD LYMPH:  no palpable lymphadenopathy in the cervical, axillary or inguinal regions LUNGS: clear to auscultation b/l with normal respiratory effort HEART: regular rate & rhythm ABDOMEN:  normoactive bowel sounds , non tender, not distended, no palpable splenomegaly. Extremity: no pedal edema PSYCH: alert & oriented x 3 with fluent speech NEURO: no focal motor/sensory deficits  LABORATORY DATA:   I have reviewed the data as listed  . CBC Latest Ref Rng & Units 05/25/2017 05/06/2017 03/21/2017  WBC 3.9 - 10.3 10e3/uL 4.5 6.7 8.4  Hemoglobin 11.6 - 15.9 g/dL 13.8 14.2 14.7  Hematocrit 34.8 - 46.6 % 42.3 40.9 43.0  Platelets 145 - 400 10e3/uL 556(H) 580(H) 678(H)    . CMP Latest Ref Rng & Units 05/25/2017 05/06/2017 03/21/2017  Glucose 70 - 140 mg/dl 88 87 96  BUN 7.0 - 26.0 mg/dL 14.'5 17 16  ' Creatinine 0.6 - 1.1 mg/dL 0.8 0.75 0.76  Sodium 136 - 145 mEq/L 140 135 139  Potassium 3.5 - 5.1 mEq/L 3.9 4.6 4.9  Chloride 96 - 106  mmol/L - 96 97  CO2 22 - 29 mEq/L 27 19(L) 26  Calcium 8.4 - 10.4 mg/dL 8.9 8.8 9.6  Total Protein 6.4 - 8.3 g/dL 6.6 6.4 6.9  Total Bilirubin 0.20 - 1.20 mg/dL 0.82 0.9 0.5  Alkaline Phos 40 - 150 U/L 143 130(H) 132(H)  AST 5 - 34 U/L '18 22 20  ' ALT 0 - 55 U/L '14 14 12     ' RADIOGRAPHIC STUDIES: I have personally reviewed the radiological images as listed and agreed with the findings in the report. No results found.  ASSESSMENT & PLAN:   83 year old Caucasian female with  #1 Persistent thrombocytosis since 1998. Platelet today are a little lower at 546k. No h/o VTE.  Plt counts have varied from 490-700k. No associated polycythemia or leukocytosis.  We did did testing to see if her thrombocytosis is clonal or reactive. And she has no evidence of clonality based on below noted genetic testing. Jak2  V617F mutation was negative CalR mutation was negative  MPL mutation was negative Jak2 Exon12 mutation negative  All her molecular testing did not show definitive clonal genetic abnormalities to clearly define her condition as being essential thrombocytosis. There is still a possibility given the persistence of her thrombocytosis that she could have essential thrombocytosis. The only way to figure this out would be to get a bone marrow examination which she has repeatedly declined.  Plan -patient was recommended to use appropriate dose of aspirin 325 mg by mouth daily. -We discussed and she continues to decline a bone marrow biopsy. -She has declined empiric use of hydroxyurea with the plan to try to suppress her platelets below 400k to try to reduce her cardiovascular and stroke risk. She has previously been given patient hand out about this medication and wants to think about it. -if for neurological symptoms are concerning for TIA/CVA this would make her need for platelet count production even more imperative. She is to see neurology tomorrow for further evaluation of her  symptoms.  #2 Iron deficiency Anemia This seems to have resolved. Ferritin levels have progressive improved from 12 and are now up to 68. . Lab Results  Component Value Date   IRON 68 05/25/2017   TIBC 273 05/25/2017   IRONPCTSAT 25 05/25/2017   (Iron and TIBC)  Lab Results  Component Value Date   FERRITIN 63 05/25/2017   #3 Intolerance to PO Iron --significant constipation. Plan -no overt need for IV iron at this time. -Continue OTC liquid Iron polysaccharide (Novaferrum- 156m/5ml - available on amazon) -- 2.5 ml daily with food to target ferritin levels of >100  -stool softner for constipation as needed -if unable to tolerate PO iron and unwilling to take this -- would recommend increase intake of iron rich foods. -will continue to monitor plt count   RTC with Dr KIrene Limboin 6 months with rpt labs Continue f/u with PCP  I spent 20 minutes counseling the patient face to face. The total time spent in the appointment was 25 minutes and more than 50% was on counseling and direct patient cares.    GSullivan LoneMD MPerkinsAAHIVMS SOcean Beach HospitalCClarity Child Guidance CenterHematology/Oncology Physician CCrittenden [facility] (Office):       [phone](Work cell):  [phone](Fax):           [phone] This document serves as a record of services personally performed by GSullivan Lone MD. It was created on her behalf by LAlean Rinne a trained medical scribe. The creation of this record is based on the scribe's personal observations and the provider's statements to them. This document has been checked and approved by the attending provider.

## 2017-05-24 NOTE — Addendum Note (Signed)
Addended by: Unice Bailey B on: 05/24/2017 12:41 PM   Modules accepted: Orders

## 2017-05-24 NOTE — Addendum Note (Signed)
Addended by: Unice Bailey B on: 05/24/2017 12:40 PM   Modules accepted: Orders

## 2017-05-25 ENCOUNTER — Other Ambulatory Visit (HOSPITAL_BASED_OUTPATIENT_CLINIC_OR_DEPARTMENT_OTHER): Payer: Medicare Other

## 2017-05-25 ENCOUNTER — Telehealth: Payer: Self-pay | Admitting: Hematology

## 2017-05-25 ENCOUNTER — Encounter: Payer: Self-pay | Admitting: Hematology

## 2017-05-25 ENCOUNTER — Ambulatory Visit (INDEPENDENT_AMBULATORY_CARE_PROVIDER_SITE_OTHER): Payer: Medicare Other | Admitting: Neurology

## 2017-05-25 ENCOUNTER — Ambulatory Visit (HOSPITAL_BASED_OUTPATIENT_CLINIC_OR_DEPARTMENT_OTHER): Payer: Medicare Other | Admitting: Hematology

## 2017-05-25 ENCOUNTER — Encounter: Payer: Self-pay | Admitting: Neurology

## 2017-05-25 VITALS — BP 184/99 | HR 75 | Temp 97.4°F | Resp 18 | Ht 61.0 in | Wt 127.0 lb

## 2017-05-25 VITALS — BP 138/79 | HR 73 | Ht 61.0 in | Wt 127.0 lb

## 2017-05-25 DIAGNOSIS — D473 Essential (hemorrhagic) thrombocythemia: Secondary | ICD-10-CM

## 2017-05-25 DIAGNOSIS — E785 Hyperlipidemia, unspecified: Secondary | ICD-10-CM

## 2017-05-25 DIAGNOSIS — R2689 Other abnormalities of gait and mobility: Secondary | ICD-10-CM | POA: Diagnosis not present

## 2017-05-25 DIAGNOSIS — R200 Anesthesia of skin: Secondary | ICD-10-CM | POA: Insufficient documentation

## 2017-05-25 DIAGNOSIS — G629 Polyneuropathy, unspecified: Secondary | ICD-10-CM | POA: Diagnosis not present

## 2017-05-25 DIAGNOSIS — I10 Essential (primary) hypertension: Secondary | ICD-10-CM | POA: Diagnosis not present

## 2017-05-25 DIAGNOSIS — E611 Iron deficiency: Secondary | ICD-10-CM

## 2017-05-25 DIAGNOSIS — D75839 Thrombocytosis, unspecified: Secondary | ICD-10-CM

## 2017-05-25 LAB — IRON AND TIBC
%SAT: 25 % (ref 21–57)
Iron: 68 ug/dL (ref 41–142)
TIBC: 273 ug/dL (ref 236–444)
UIBC: 204 ug/dL (ref 120–384)

## 2017-05-25 LAB — CBC WITH DIFFERENTIAL/PLATELET
BASO%: 1.1 % (ref 0.0–2.0)
BASOS ABS: 0.1 10*3/uL (ref 0.0–0.1)
EOS%: 4.9 % (ref 0.0–7.0)
Eosinophils Absolute: 0.2 10*3/uL (ref 0.0–0.5)
HEMATOCRIT: 42.3 % (ref 34.8–46.6)
HEMOGLOBIN: 13.8 g/dL (ref 11.6–15.9)
LYMPH#: 1.5 10*3/uL (ref 0.9–3.3)
LYMPH%: 34 % (ref 14.0–49.7)
MCH: 31.9 pg (ref 25.1–34.0)
MCHC: 32.6 g/dL (ref 31.5–36.0)
MCV: 97.7 fL (ref 79.5–101.0)
MONO#: 0.7 10*3/uL (ref 0.1–0.9)
MONO%: 15.5 % — AB (ref 0.0–14.0)
NEUT%: 44.5 % (ref 38.4–76.8)
NEUTROS ABS: 2 10*3/uL (ref 1.5–6.5)
Platelets: 556 10*3/uL — ABNORMAL HIGH (ref 145–400)
RBC: 4.33 10*6/uL (ref 3.70–5.45)
RDW: 13.3 % (ref 11.2–14.5)
WBC: 4.5 10*3/uL (ref 3.9–10.3)

## 2017-05-25 LAB — COMPREHENSIVE METABOLIC PANEL
ALBUMIN: 3.5 g/dL (ref 3.5–5.0)
ALK PHOS: 143 U/L (ref 40–150)
ALT: 14 U/L (ref 0–55)
AST: 18 U/L (ref 5–34)
Anion Gap: 9 mEq/L (ref 3–11)
BUN: 14.5 mg/dL (ref 7.0–26.0)
CALCIUM: 8.9 mg/dL (ref 8.4–10.4)
CO2: 27 mEq/L (ref 22–29)
CREATININE: 0.8 mg/dL (ref 0.6–1.1)
Chloride: 104 mEq/L (ref 98–109)
EGFR: >60 mL/min/{1.73_m2} (ref >60)
GLUCOSE: 88 mg/dL (ref 70–140)
Potassium: 3.9 mEq/L (ref 3.5–5.1)
SODIUM: 140 meq/L (ref 136–145)
TOTAL PROTEIN: 6.6 g/dL (ref 6.4–8.3)
Total Bilirubin: 0.82 mg/dL (ref 0.20–1.20)

## 2017-05-25 LAB — FERRITIN: FERRITIN: 63 ng/mL (ref 9–269)

## 2017-05-25 NOTE — Patient Instructions (Addendum)
-   continue ASA for stroke prevention and thrombocytosis - check BP at home and record - Follow up with primary care physician for stroke risk factor modification. Recommend maintain blood pressure goal 120-140/80, diabetes with hemoglobin A1c goal below 6.5% and lipids with LDL cholesterol goal below 100 mg/dL. - will do nerve conduction study - let us know if you need PT and we can order it for you - Follow-up with hematology - follow up in 2 months.

## 2017-05-25 NOTE — Patient Instructions (Signed)
Thank you for choosing [facility] to provide your oncology and hematology care.  To afford each patient quality time with our providers, please arrive 30 minutes before your scheduled appointment time.  If you arrive late for your appointment, you may be asked to reschedule.  We strive to give you quality time with our providers, and arriving late affects you and other patients whose appointments are after yours.   If you are a no show for multiple scheduled visits, you may be dismissed from the clinic at the providers discretion.    Again, thank you for choosing [facility], our hope is that these requests will decrease the amount of time that you wait before being seen by our physicians.  ______________________________________________________________________  Should you have questions after your visit to the [facility], please contact our office at [phone] between the hours of 8:30 and 4:30 p.m.    Voicemails left after 4:30p.m will not be returned until the following business day.    For prescription refill requests, please have your pharmacy contact us directly.  Please also try to allow 48 hours for prescription requests.    Please contact the scheduling department for questions regarding scheduling.  For scheduling of procedures such as PET scans, CT scans, MRI, Ultrasound, etc please contact central scheduling at [phone].    Resources For Cancer Patients and Caregivers:   Oncolink.org:  A wonderful resource for patients and healthcare providers for information regarding your disease, ways to tract your treatment, what to expect, etc.     American Cancer Society:  800-227-2345  Can help patients locate various types of support and financial assistance  Cancer Care: 1-800-813-HOPE (4673) Provides financial assistance, online support groups, medication/co-pay assistance.    [place] DSS:  336-641-3447 Where to apply for food  stamps, Medicaid, and utility assistance  Medicare Rights Center: 800-333-4114 Helps people with Medicare understand their rights and benefits, navigate the Medicare system, and secure the quality healthcare they deserve  SCAT: 336-333-6589 [place] [employer]'s shared-ride transportation service for eligible riders who have a disability that prevents them from riding the fixed route bus.    For additional information on assistance programs please contact our social worker:   Grier Hock/Abigail Elmore:  [phone]            

## 2017-05-25 NOTE — Progress Notes (Signed)
NEUROLOGY CLINIC FOLLOW UP NOTE  NAME: Stacie Garza DOB: May 06, 1928  HPI: Stacie GUIN is a 83 y.o. female with PMH of HTN, hypothyroidism, HLD who presents as a new patient for dizziness.   Pt stated that started in 2015, she occasional has lightheadedness on getting up from chair or bed. In 01/2014, she had episode of chest pain during reading, nausea but no vomiting, went to bed, and felt good the second day. Went to see Dr. Hamilton Capri and extensive cardiac work up all negative. Since then, she started to have intermittent lightheadedness on walking, motion related, felt foggy headed and staggering, could lasting a whole day long. Initially, once every a couple of months, but since 05/2015, it increased to once every month. Since this year, it is more frequent, and now became all the time whenever she starts to walk, unstable with gait and like to hold onto things, fear of falling, however, she never had fall and never used any cane or walker, denies HA. She never felt lightheadedness when she is sitting or lying. She also complains of CP on wakening up in the middle of night, once every 2 weeks since this year. She had follow up with Dr. Hamilton Capri and also did 24 hour Holter monitoring was negative. She was referred to neurology for further evaluation.  She has hx of HTN and on azilsartan, BP today 140/85. HLD but not tolerating statins, LDL 157 in 08/2013. Hypothyroidism on synthroid. She also has essential thrombocythemia followed with Dr. Tressie Stalker and JAK2 was positive. She is on ASA, last platelet level 600 in 09/2015. She denies hx of stroke. Denies any depression or anxiety.  She has CUS in 2015 which was unremarkable. Denies smoking or alcohol. Mom died of old age, and dad died of heart disease at age of 55, sister died of cancer.   01-Feb-2016 follow up - pt has been doing well. Still complain some gait imbalance, stumbles but less severe and had no fall. She has been working on the yard  planting trees and flowers, and has declined PT/OT due to her busy schedule. She also declined sleep study as she believes she does not have OSA. She had MRI brain which showed no acute infarct, but punctate cerebellar lacunes, apparently asymptomatic. She had CUS, and TCD MES were both negative. She BP at home between 110-130, although in clinic today 149/84. She is on ASA but not statin due to allergy.   05/12/16 follow up - she has been doing well, no more dizziness or lightheadedness. Walking much improved, now at her baseline. Had one session of PT treatment, however not able to tolerate. Followed with Dr. [name] for thrombocytosis, checked Barnabas Lister 2, CALR as well as MPL mutations, all negative. However, discovered very low ferritin level. About to start iron supplements therapy. Also followed with PCP for BP management. BP today 151/94. She has been on aspirin 325 3 times a day for long time, tolerating well. Does not want to change aspirin dosage at this time.  Interval history: During the interval time, patient has been doing well until 12/2016 when patient started to have numbness in both feet and both ankles.  However the numbness still on and off, not all day long.  Symptoms gradually anymore pronounced and became constant all day long every day in 02/2017.  Denies pain, tingling, but feels like feet  swollen.  For the last several weeks, she also failed left arm has numbness feedings, more at fingers.  Starting  in 03/2017 she felt unsteady walking, sensation of marbles on the bottom of feet, and started to use walker or cane.  She stated that sitting or lying is perfectly okay, however getting up walking she felt unsteady.   She continue follow with oncology for thrombocytosis annually.  BP stable at home, today in clinic BP 138/79.   Past Medical History:  Diagnosis Date  . Essential hypertension   . Hyperlipidemia   . Hypothyroidism   . Thrombocytosis ([place])   . TIA (transient ischemic  attack) 2004   Past Surgical History:  Procedure Laterality Date  . ABDOMINAL HYSTERECTOMY    . HERNIA REPAIR     Family History  Problem Relation Age of Onset  . Arthritis Mother   . Heart attack Father   . Heart disease Father   . Arthritis Sister   . Cancer Sister   . Diabetes Sister   . Heart disease Brother    Current Outpatient Prescriptions  Medication Sig Dispense Refill  . aspirin 325 MG tablet Take 325 mg by mouth once.     Marland Kitchen levothyroxine (SYNTHROID, LEVOTHROID) 150 MCG tablet Take 1 tablet (150 mcg total) by mouth as directed. 30 tablet 2  . losartan (COZAAR) 25 MG tablet Take 1 tablet (25 mg total) by mouth daily. 30 tablet 1  . multivitamin-lutein (OCUVITE-LUTEIN) CAPS capsule Take by mouth.    [name] Glycol-Propyl Glycol 0.4-0.3 % SOLN     . UNABLE TO FIND Med Name: Garlic take one pill daily    . vitamin C (ASCORBIC ACID) 500 MG tablet Take by mouth.    . zinc gluconate 50 MG tablet Take by mouth.     No current facility-administered medications for this visit.    Allergies  Allergen Reactions  . Flaxseed (Linseed) Itching, Other (See Comments) and Rash    Blood in stool  . Chlorthalidone Other (See Comments)    Dry eye (Eyelid stuck to eyeball)  . Lavender Oil Other (See Comments)    Head felt dizzy and foggy, left arm pain, fatigue, off balance  . Metoprolol Other (See Comments)    Headache,  Insomnia, Unstable gait, Lethargy, intermittent SOB.  (Last dose 07/24/2014)  . Monascus Purpureus [employer] Other (See Comments) and Nausea Only    Off balance, head felt heavy, aching in shoulders, fatigue, tightness in chest  . Statins Other (See Comments)    Pain in back of head and neck, UTI, back pain  . Typhoid Vaccines Other (See Comments)    redness  . Atorvastatin   . Bio-Flax   . Conjugated Estrogens   . Ezetimibe   . Simvastatin    Social History   Social History  . Marital status: Divorced    Spouse name: N/A  . Number of children: N/A   . Years of education: N/A   Occupational History  . Retired    Social History Main Topics  . Smoking status: Never Smoker  . Smokeless tobacco: Never Used  . Alcohol use No  . Drug use: No  . Sexual activity: Not Currently   Other Topics Concern  . Not on file   Social History Narrative  . No narrative on file    Review of Systems Full 14 system review of systems performed and notable only for those listed, all others are neg:  Constitutional:   Cardiovascular:  Ear/Nose/Throat: Hearing loss Skin:  Eyes:   Respiratory:   Gastroitestinal:   Genitourinary:  Hematology/Lymphatic:   Endocrine:  Musculoskeletal:   Allergy/Immunology:   Neurological:  weakness, numbness, memory loss Psychiatric:  Sleep:   Physical Exam  Vitals:   05/25/17 1446  BP: 138/79  Pulse: 73    General - Well nourished, well developed, in no apparent distress.  Ophthalmologic - Sharp disc margins OU.  Cardiovascular - Regular rate and rhythm with no murmur.   Neck - supple, no nuchal rigidity .  Mental Status -  Level of arousal and orientation to time, place, and person were intact. Language including expression, naming, repetition, comprehension, reading, and writing was assessed and found intact. Attention span and concentration were normal. Recent and remote memory were intact, 3/3 registration and 3/3 delayed recall Fund of Knowledge was assessed and was intact.  Cranial Nerves II - XII - II - Visual field intact OU. III, IV, VI - Extraocular movements intact. V - Facial sensation intact bilaterally. VII - Facial movement intact bilaterally. VIII - Hearing & vestibular intact bilaterally. X - Palate elevates symmetrically. XI - Chin turning & shoulder shrug intact bilaterally. XII - Tongue protrusion intact.  Motor Strength - The patient's strength was normal in all extremities and pronator drift was absent.  Bulk was normal and fasciculations were absent.   Motor Tone  - Muscle tone was assessed at the neck and appendages and was normal.  Reflexes - The patient's reflexes were normal in all extremities and she had no pathological reflexes.  Sensory - on sensory exam, BUE symmetrical light touch, pinprick and vibration. BLE inconsistent exam, with repeated exam showed likely below mid foreleg symmetrical light touch, slightly increased pinprick sensation, decreased vibration with preserved proprioception.   Coordination - The patient had normal movements in the hands and feet with no ataxia or dysmetria.  Tremor was absent.  Gait and Station - walk with walker, steady, normal stride.  All  Imaging 06/11/14 MRI brain - Mild chronic small vessel ischemic changes. Otherwise normal.  06/11/14 CUS - 1. No hemodynamically significant stenosis on either side 2. Both vertebral arteries are patent with antegrade flow.  MRI 11/26/15 1. Moderate periventricular and subcortical and pontine chronic small vessel ischemic disease. 2. Bilateral cerebellar chronic lacunar infarcts. 3. No acute findings.  CUS 12/04/15- right ICA 50-69% stenosis  TCD emboli detection 12/04/15 - no MES  Lab Review 10/07/15 platelet 600, LDL 127  05/28/14 ANA neg, B12 1198 (on B12 1065mcg daily), RPR neg, A1C 5.9  JAK2 V617F, Rfx CALR/E12/MPL5 - negative  Component     Latest Ref Rng & Units 01/30/2016         4:30 PM  Ferritin     11 - 307 ng/mL 12      Assessment:   In summary, Stacie Garza is a 83 y.o. female with PMH of HTN, hypothyroidism, HLD, thrombocytosis (possible essential thrombocythemia) who presents as a new patient for chronic lightheadedness. Initially intermittent but gradually increased frequency and now all the time, but only happens with walking and motion related. Exam showed more functional component. Her condition more likely chronic subjective dizziness, but due to risk factors of HTN, HLD not tolerance to statin, essential thrombocythemia, need to rule out  stroke first. MRI no acute infarct but punctate cerebellar lacunes. CUS right ICA 50-69% stenosis and TCD MES negative. Followed with hematology for essential thrombocythemia, and JAK2, CLAR and MPL gene mutation negative. However, very low ferritin levels. She'll continue follow up with Dr. [name] for iron supplement therapy. Dizziness and lightheadedness as well as walking difficulty resolved. Not  able to tolerate PT for balance training. On ASA. Developed feet and ankle numbness, unsteady gait since 12/2016. Exam showed decreased vibration sensation b/l ankle and feet, however, other sensation modality inconsistent exam. Walk with walker but normal stride and gait. Pt likely to have mild polyneuropathy b/l feet which was exaggerated by anxiety. Will do EMG/NCS. Pt refused PT so far.   Plan: - continue ASA for stroke prevention and thrombocytosis - check BP at home and record - Follow up with primary care physician for stroke risk factor modification. Recommend maintain blood pressure goal 120-140/80, diabetes with hemoglobin A1c goal below 6.5% and lipids with LDL cholesterol goal below 100 mg/dL. - will do EMG/NCS - pt will let us know if she would like PT  - Follow-up with hematology for thrombocytosis - avoid fall, OK to use walker or cane if needed. - follow up in 2 months.   I spent more than 25 minutes of face to face time with the patient. Greater than 50% of time was spent in counseling and coordination of care. We discussed etiology of bilateral feet numbness, continue workup and treatment options.   Orders Placed This Encounter  Procedures  . NCV with EMG(electromyography)    B/l foot ankle and left arm numbness. Thanks.    Standing Status:   Future    Standing Expiration Date:   05/25/2018    Order Specific Question:   Where should this test be performed?    Answer:   [facility]    No orders of the defined types were placed in this encounter.   Patient Instructions  - continue ASA  for stroke prevention and thrombocytosis - check BP at home and record - Follow up with primary care physician for stroke risk factor modification. Recommend maintain blood pressure goal 120-140/80, diabetes with hemoglobin A1c goal below 6.5% and lipids with LDL cholesterol goal below 100 mg/dL. - will do nerve conduction study - let us know if you need PT and we can order it for you - Follow-up with hematology - follow up in 2 months.    Rosalin Hawking, MD PhD [facility] [address] [place], [place] [address] [phone]

## 2017-05-25 NOTE — Telephone Encounter (Signed)
Gave avs and calendar for April 2019 °

## 2017-05-26 LAB — FOLATE: Folate: 11.6 ng/mL (ref >3.0)

## 2017-05-26 LAB — VITAMIN B12: Vitamin B12: 573 pg/mL (ref 232–1245)

## 2017-06-06 ENCOUNTER — Ambulatory Visit (INDEPENDENT_AMBULATORY_CARE_PROVIDER_SITE_OTHER): Payer: Medicare Other | Admitting: Family Medicine

## 2017-06-06 ENCOUNTER — Encounter: Payer: Self-pay | Admitting: Family Medicine

## 2017-06-06 VITALS — BP 187/96 | HR 79 | Temp 96.8°F | Ht 61.0 in | Wt 126.0 lb

## 2017-06-06 DIAGNOSIS — R200 Anesthesia of skin: Secondary | ICD-10-CM | POA: Diagnosis not present

## 2017-06-06 DIAGNOSIS — I10 Essential (primary) hypertension: Secondary | ICD-10-CM

## 2017-06-06 DIAGNOSIS — R2689 Other abnormalities of gait and mobility: Secondary | ICD-10-CM

## 2017-06-06 DIAGNOSIS — G629 Polyneuropathy, unspecified: Secondary | ICD-10-CM | POA: Diagnosis not present

## 2017-06-06 DIAGNOSIS — R42 Dizziness and giddiness: Secondary | ICD-10-CM

## 2017-06-06 MED ORDER — OLMESARTAN MEDOXOMIL 40 MG PO TABS: 40.0000 mg | ORAL_TABLET | Freq: Every day | ORAL | 3 refills | Status: DC

## 2017-06-06 NOTE — Patient Instructions (Signed)
Discontinue Losartan  Start new med for BP called Benicar (may be a generic name) 40 mg - once a day

## 2017-06-06 NOTE — Progress Notes (Signed)
Subjective:  Patient ID: Stacie Garza, female    DOB: 1928-04-27  Age: 83 y.o. MRN: [id number]  CC: Follow-up (1 mo)   HPI Stacie Garza presents for [employer] presents for recheck of her dizziness.  She says that it generally happens when she is mobile.  She will lean to the left.  It is intermittent.  She denies any falling stumbling etc.  She started using a cane in her home to avoid falling.  That seems to be adequate as she has not had any falls since her last visit.  She did go to neurology.  They have arranged for testing that sounds like EMG/NCV to be done in 3 days.  This is primarily due to the numbness and weakness in the legs.  That symptom is equal bilaterally.  It is unchanged since her last visit.  She is a bit confused about her blood pressure.  She does not think that the change made a bit of difference in her condition.  She wonders why her blood pressure was very elevated on the morning of October 31 but normal in another doctor's office on the afternoon of October 31.  Chart review reveals blood pressure 720N systolic in the morning of October 31 and 470J systolic in the afternoon of October 31 of this year.  Depression screen [facility] 2/9 06/06/2017 04/20/2017 03/21/2017  Decreased Interest 0 0 0  Down, Depressed, Hopeless 0 0 0  PHQ - 2 Score 0 0 0    History Stacie Garza has a past medical history of Essential hypertension, Hyperlipidemia, Hypothyroidism, Thrombocytosis ([place]), and TIA (transient ischemic attack) (2004).   She has a past surgical history that includes Hernia repair and Abdominal hysterectomy.   Her family history includes Arthritis in her mother and sister; Cancer in her sister; Diabetes in her sister; Heart attack in her father; Heart disease in her brother and father.She reports that  has never smoked. she has never used smokeless tobacco. She reports that she does not drink alcohol or use drugs.    ROS Review of Systems  Constitutional: Negative for activity  change, appetite change and fever.  HENT: Negative for congestion, rhinorrhea and sore throat.   Eyes: Negative for visual disturbance.  Respiratory: Negative for cough and shortness of breath.   Cardiovascular: Negative for chest pain and palpitations.  Gastrointestinal: Negative for abdominal pain, diarrhea and nausea.  Genitourinary: Negative for dysuria.  Musculoskeletal: Negative for arthralgias and myalgias.  Neurological: Positive for dizziness, weakness and numbness.    Objective:  BP (!) 187/96   Pulse 79   Temp (!) 96.8 F (36 C) (Oral)   Ht 5\' 1"  (1.549 m)   Wt 126 lb (57.2 kg)   BMI 23.81 kg/m   BP Readings from Last 3 Encounters:  06/06/17 (!) 187/96  05/25/17 138/79  05/25/17 (!) 184/99    Wt Readings from Last 3 Encounters:  06/06/17 126 lb (57.2 kg)  05/25/17 127 lb (57.6 kg)  05/25/17 127 lb (57.6 kg)     Physical Exam  Constitutional: She is oriented to person, place, and time. She appears well-developed and well-nourished. No distress.  HENT:  Head: Normocephalic and atraumatic.  Right Ear: External ear normal.  Left Ear: External ear normal.  Nose: Nose normal.  Mouth/Throat: Oropharynx is clear and moist.  Eyes: Conjunctivae and EOM are normal. Pupils are equal, round, and reactive to light.  Neck: Normal range of motion. Neck supple. No thyromegaly present.  Cardiovascular: Normal  rate, regular rhythm and normal heart sounds.  No murmur heard. Pulmonary/Chest: Effort normal and breath sounds normal. No respiratory distress. She has no wheezes. She has no rales.  Abdominal: Soft. Bowel sounds are normal. She exhibits no distension. There is no tenderness.  Lymphadenopathy:    She has no cervical adenopathy.  Neurological: She is alert and oriented to person, place, and time. She has normal reflexes.  Skin: Skin is warm and dry.  Psychiatric: She has a normal mood and affect. Her behavior is normal. Judgment and thought content normal.       Assessment & Plan:   Jai was seen today for follow-up.  Diagnoses and all orders for this visit:  Imbalance  Dizziness  Numbness in feet  Neuropathy  Essential hypertension  Other orders -     olmesartan (BENICAR) 40 MG tablet; Take 1 tablet (40 mg total) daily by mouth.       I have discontinued Reita Chard. Urschel's losartan. I am also having her start on olmesartan. Additionally, I am having her maintain her zinc gluconate, aspirin, multivitamin-lutein, vitamin C, Polyethyl Glycol-Propyl Glycol, UNABLE TO FIND, and levothyroxine.  Allergies as of 06/06/2017      Reactions   Flaxseed (linseed) Itching, Other (See Comments), Rash   Blood in stool   Chlorthalidone Other (See Comments)   Dry eye (Eyelid stuck to eyeball)   Lavender Oil Other (See Comments)   Head felt dizzy and foggy, left arm pain, fatigue, off balance   Metoprolol Other (See Comments)   Headache,  Insomnia, Unstable gait, Lethargy, intermittent SOB.  (Last dose 07/24/2014)   Monascus Purpureus Went Yeast Other (See Comments), Nausea Only   Off balance, head felt heavy, aching in shoulders, fatigue, tightness in chest   Statins Other (See Comments)   Pain in back of head and neck, UTI, back pain   Typhoid Vaccines Other (See Comments)   redness   Atorvastatin    Bio-flax    Conjugated Estrogens    Ezetimibe    Simvastatin       Medication List        Accurate as of 06/06/17  2:49 PM. Always use your most recent med list.          aspirin 325 MG tablet Take 325 mg by mouth once.   levothyroxine 150 MCG tablet Commonly known as:  SYNTHROID, LEVOTHROID Take 1 tablet (150 mcg total) by mouth as directed.   multivitamin-lutein Caps capsule Take by mouth.   olmesartan 40 MG tablet Commonly known as:  BENICAR Take 1 tablet (40 mg total) daily by mouth.   Polyethyl Glycol-Propyl Glycol 0.4-0.3 % Soln   UNABLE TO FIND Med Name: Garlic take one pill daily   vitamin C 500 MG  tablet Commonly known as:  ASCORBIC ACID Take by mouth.   zinc gluconate 50 MG tablet Take by mouth.        Follow-up: Return in about 1 month (around 07/06/2017) for Hypothyroidism.  Claretta Fraise, M.D.

## 2017-06-09 ENCOUNTER — Encounter (INDEPENDENT_AMBULATORY_CARE_PROVIDER_SITE_OTHER): Payer: Medicare Other | Admitting: Diagnostic Neuroimaging

## 2017-06-09 ENCOUNTER — Ambulatory Visit (INDEPENDENT_AMBULATORY_CARE_PROVIDER_SITE_OTHER): Payer: Medicare Other | Admitting: Diagnostic Neuroimaging

## 2017-06-09 DIAGNOSIS — R200 Anesthesia of skin: Secondary | ICD-10-CM

## 2017-06-09 DIAGNOSIS — G629 Polyneuropathy, unspecified: Secondary | ICD-10-CM | POA: Diagnosis not present

## 2017-06-09 DIAGNOSIS — R2689 Other abnormalities of gait and mobility: Secondary | ICD-10-CM

## 2017-06-09 DIAGNOSIS — Z0289 Encounter for other administrative examinations: Secondary | ICD-10-CM

## 2017-06-10 NOTE — Procedures (Signed)
[facility]  NCS (NERVE CONDUCTION STUDY) WITH EMG (ELECTROMYOGRAPHY) REPORT   STUDY DATE: 06/09/17 PATIENT NAME: Stacie Garza DOB: [birth year] MRN: [id number]  ORDERING CLINICIAN: Rosalin Hawking, MD PhD   TECHNOLOGIST: Oneita Jolly ELECTROMYOGRAPHER: Earlean Polka. Kelyn Koskela, MD  CLINICAL INFORMATION: 83 year old female with left hand and bilateral leg numbness.  Patient denies back pain.  Patient has history of remote right wrist fracture.   FINDINGS: NERVE CONDUCTION STUDY: Right median motor response has normal distal latency, decreased amplitude, normal conduction velocity.  Left median and left ulnar motor responses are normal.  Left peroneal motor response has prolonged distal latency, decreased amplitude, normal conduction velocity.  Right peroneal and bilateral tibial motor responses are normal.  Bilateral median sensory responses have prolonged peak latencies, and slightly decreased amplitude on the right, normal amplitude on the left.    Right ulnar sensory response has prolonged peak latency and normal amplitude.  Left ulnar sensory response is normal.  Right radial, bilateral sural and bilateral superficial peroneal sensory responses are normal.   NEEDLE ELECTROMYOGRAPHY:  Needle examination of the left vastus medialis, tibia center, gastrocnemius and left lumbar paraspinal muscles notable for complex repetitive discharges in left gastrocnemius and left lumbar paraspinal muscles.  Otherwise normal.   IMPRESSION:   Abnormal study demonstrating: 1. Bilateral median neuropathies at the wrists consistent with bilateral carpal tunnel syndrome.  This is slightly more affected on the right than left. 2. Mild left peroneal motor neuropathy versus left L5 radiculopathy.  Needle examination does not further localize. 3. No definite evidence of widespread underlying polyneuropathy.     INTERPRETING PHYSICIAN:  Penni Bombard, MD Certified in Neurology,  Neurophysiology and Neuroimaging  [facility] [address], [place], [place]  [address] [phone]    [facility]    Nerve / Sites Muscle Latency Ref. Amplitude Ref. Rel Amp Segments Distance Velocity Ref. Area    ms ms mV mV %  cm m/s m/s mVms  L Median - APB     Wrist APB 3.9 ?4.4 6.0 ?4.0 100 Wrist - APB 7   13.9     Upper arm APB 7.0  5.9  98.5 Upper arm - Wrist 17 54 ?49 15.1  R Median - APB     Wrist APB 4.2 ?4.4 3.3 ?4.0 100 Wrist - APB 7   9.1     Upper arm APB 8.1  2.9  88.1 Upper arm - Wrist 19 49 ?49 6.3  L Ulnar - ADM     Wrist ADM 3.0 ?3.3 6.7 ?6.0 100 Wrist - ADM 7   17.9     B.Elbow ADM 6.3  6.4  95.8 B.Elbow - Wrist 17 52 ?49 17.8     A.Elbow ADM 8.1  5.9  93.3 A.Elbow - B.Elbow 10 55 ?49 17.0         A.Elbow - Wrist      L Peroneal - EDB     Ankle EDB 6.6 ?6.5 0.6 ?2.0 100 Ankle - EDB 9   1.7     Fib head EDB 13.2  0.5  89.9 Fib head - Ankle 28 42 ?44 2.0     Pop fossa EDB 15.5  0.5  97.8 Pop fossa - Fib head 10 44 ?44 2.5         Pop fossa - Ankle      R Peroneal - EDB     Ankle EDB 5.8 ?6.5 3.1 ?2.0 100 Ankle - EDB 9  10.0     Fib head EDB 11.8  2.9  91.6 Fib head - Ankle 27 45 ?44 9.8     Pop fossa EDB 13.9  2.9  102 Pop fossa - Fib head 10 47 ?44 12.8         Pop fossa - Ankle      L Tibial - AH     Ankle AH 4.5 ?5.8 8.7 ?4.0 100 Ankle - AH 9   24.1     Pop fossa AH 11.8  5.8  67.2 Pop fossa - Ankle 30 41 ?41 19.5  R Tibial - AH     Ankle AH 4.0 ?5.8 8.7 ?4.0 100 Ankle - AH 9   22.8     Pop fossa AH 11.3  7.2  83 Pop fossa - Ankle 30 41 ?41 20.2                     SNC    Nerve / Sites Rec. Site Peak Lat Ref.  Amp Ref. Segments Distance Peak Diff Ref.    ms ms V V  cm ms ms  R Radial - Anatomical snuff box (Forearm)     Forearm Wrist 2.9 ?2.9 16 ?15 Forearm - Wrist 10    L Sural - Ankle (Calf)     Calf Ankle 3.6 ?4.4 6 ?6 Calf - Ankle 14    R Sural - Ankle (Calf)     Calf Ankle 3.3 ?4.4 7 ?6 Calf - Ankle 14    L  Superficial peroneal - Ankle     Lat leg Ankle 4.1 ?4.4 7 ?6 Lat leg - Ankle 14    R Superficial peroneal - Ankle     Lat leg Ankle 4.3 ?4.4 8 ?6 Lat leg - Ankle 14    L Median, Ulnar - Transcarpal comparison     Median Palm Wrist 2.5 ?2.2 25 ?35 Median Palm - Wrist 8       Ulnar Palm Wrist 2.1 ?2.2 13 ?12 Ulnar Palm - Wrist 8          Median Palm - Ulnar Palm  0.4 ?0.4  L Median - Orthodromic (Dig II, Mid palm)     Dig II Wrist 3.5 ?3.4 17 ?10 Dig II - Wrist 13    R Median - Orthodromic (Dig II, Mid palm)     Dig II Wrist 4.0 ?3.4 7 ?10 Dig II - Wrist 13    L Ulnar - Orthodromic, (Dig V, Mid palm)     Dig V Wrist 2.9 ?3.1 8 ?5 Dig V - Wrist 11    R Ulnar - Orthodromic, (Dig V, Mid palm)     Dig V Wrist 3.5 ?3.1 6 ?5 Dig V - Wrist 79                            F  Wave    Nerve F Lat Ref.   ms ms  L Tibial - AH 48.7 ?56.0  R Tibial - AH 48.9 ?56.0  L Ulnar - ADM 25.3 ?32.0           EMG full       EMG Summary Table    Spontaneous MUAP Recruitment  Muscle IA Fib PSW Fasc Other Amp Dur. Poly Pattern  L. Vastus medialis Normal None None None _______ Normal Normal Normal Normal  L. Tibialis anterior Normal None None None _______ Normal Normal  Normal Normal  L. Gastrocnemius (Medial head) Normal None None None CRDs Normal Normal Normal Normal  L. Lumbar paraspinals Increased None None None CRDs Normal Normal Normal Normal

## 2017-06-13 ENCOUNTER — Telehealth: Payer: Self-pay

## 2017-06-13 ENCOUNTER — Telehealth: Payer: Self-pay | Admitting: Family Medicine

## 2017-06-13 ENCOUNTER — Other Ambulatory Visit: Payer: Self-pay | Admitting: *Deleted

## 2017-06-13 DIAGNOSIS — H814 Vertigo of central origin: Secondary | ICD-10-CM

## 2017-06-13 NOTE — Telephone Encounter (Signed)
Returned patient's phone call.  Patient states that she still feels lightheaded when standing and moving around.  Worse when turning to the right. She states that BPs are no better.  Still in the 150s/80s and 170s/80s.   Offered patient an appt and patient declined.  Patient states that she would like a MRI if possible

## 2017-06-13 NOTE — Telephone Encounter (Signed)
Rn call patient that the nerve conduction test showed no significant neuropathy. It does show bilateral carpel tunnel syndrome in her hands, not severe, not specific treatment,just observe. Continue treatment plan. Pt verbalized understanding. ------

## 2017-06-13 NOTE — Telephone Encounter (Signed)
-----   Message from Rosalin Hawking, MD sent at 06/11/2017  5:36 PM EST ----- Could you please let the patient know that the nerve conduction test done recently in our office did not show significant neuropathy but she does have b/l carpel tunnel syndrome in her hands, not severe, not need specific treatment and just observe. Please continue current treatment. Thanks.  Rosalin Hawking, MD PhD Stroke Neurology 06/11/2017 5:36 PM

## 2017-06-13 NOTE — Telephone Encounter (Signed)
Patient aware that MRI was ordered

## 2017-06-13 NOTE — Telephone Encounter (Signed)
Please order MRI brain

## 2017-06-17 ENCOUNTER — Ambulatory Visit (HOSPITAL_COMMUNITY)
Admission: RE | Admit: 2017-06-17 | Discharge: 2017-06-17 | Disposition: A | Discharge status: Home / Self Care | Payer: Medicare Other | Source: Ambulatory Visit | Attending: Family Medicine | Admitting: Family Medicine

## 2017-06-17 DIAGNOSIS — H8149 Vertigo of central origin, unspecified ear: Secondary | ICD-10-CM | POA: Diagnosis not present

## 2017-06-17 DIAGNOSIS — I6381 Other cerebral infarction due to occlusion or stenosis of small artery: Secondary | ICD-10-CM | POA: Insufficient documentation

## 2017-06-17 DIAGNOSIS — I639 Cerebral infarction, unspecified: Secondary | ICD-10-CM | POA: Diagnosis not present

## 2017-06-17 DIAGNOSIS — H814 Vertigo of central origin: Secondary | ICD-10-CM

## 2017-06-17 MED ORDER — GADOBENATE DIMEGLUMINE 529 MG/ML IV SOLN
10.0000 mL | Freq: Once | INTRAVENOUS | Status: AC | PRN
  Administered 2017-06-17: 10 mL via INTRAVENOUS

## 2017-06-21 ENCOUNTER — Telehealth: Payer: Self-pay | Admitting: Family Medicine

## 2017-06-21 NOTE — Telephone Encounter (Signed)
They are in a result note attached to the report. WS

## 2017-06-22 NOTE — Telephone Encounter (Signed)
Appt made for tomorrow per Dr Livia Snellen to review scan

## 2017-06-22 NOTE — Telephone Encounter (Signed)
Will you please contact patient and discuss results

## 2017-06-22 NOTE — Telephone Encounter (Signed)
It would be best if she comes in  For a consult.

## 2017-06-23 ENCOUNTER — Ambulatory Visit (INDEPENDENT_AMBULATORY_CARE_PROVIDER_SITE_OTHER): Payer: Medicare Other | Admitting: Family Medicine

## 2017-06-23 ENCOUNTER — Encounter: Payer: Self-pay | Admitting: Family Medicine

## 2017-06-23 VITALS — BP 166/97 | HR 93 | Temp 96.9°F | Ht 61.0 in | Wt 123.0 lb

## 2017-06-23 DIAGNOSIS — I6930 Unspecified sequelae of cerebral infarction: Secondary | ICD-10-CM | POA: Diagnosis not present

## 2017-06-23 DIAGNOSIS — I6381 Other cerebral infarction due to occlusion or stenosis of small artery: Secondary | ICD-10-CM

## 2017-06-23 MED ORDER — RIVAROXABAN (XARELTO) VTE STARTER PACK (15 & 20 MG): ORAL_TABLET | ORAL | 0 refills | Status: DC

## 2017-06-23 NOTE — Progress Notes (Signed)
Patient in today for extensive consultation regarding her recent MRI.  The patient is continuing to walk off balance frequently.  She is very frustrated with her inability to walk straight and she is concerned about falling.  Although she has not fallen she is relying more on her walker.  She does have a tendency to veer toward the left.  Her MRI showed that she had a cerebellar lacunar infarct that was either acute or subacute.  However its presence and location corresponds to her symptoms.  Of note is that she also has some numbness in her feet that corresponds with some older lacunar infarcts in her basal ganglia.  That report was reviewed.  Additionally we discussed her blood pressure as related to her risk of stroke.  Today's consultation took greater than 30 minutes.  More than half of that time was spent in discussion and planning of her care regarding her lacunar infarct.  1. Multiple lacunar infarcts     Meds ordered this encounter  Medications  . Rivaroxaban 15 & 20 MG TBPK    Sig: Take as directed on package.    Dispense:  51 each    Refill:  0      Follow up a will be in 3 weeks  Claretta Fraise, MD

## 2017-06-24 ENCOUNTER — Telehealth: Payer: Self-pay | Admitting: Family Medicine

## 2017-06-24 MED ORDER — RIVAROXABAN 15 MG PO TABS: 15.0000 mg | ORAL_TABLET | Freq: Two times a day (BID) | ORAL | 0 refills | Status: DC

## 2017-06-24 NOTE — Telephone Encounter (Signed)
If she wants to do that, okay, but I suggest aggrenox 1 cap BID (#60, 5 refill) will work about as well and be much cheaper.  Okay to send in either way pt prefers.

## 2017-06-24 NOTE — Telephone Encounter (Signed)
lmtcb

## 2017-06-24 NOTE — Telephone Encounter (Signed)
Daughter states that the xarelto started kit is $900. Daughter spoke with pharmacy and states that if you send in Xarelto 49m BID for 21 days it will be $592 and it will be the same amount as in the started kit. Please advise and send if approved.

## 2017-06-24 NOTE — Telephone Encounter (Signed)
Xarelto 15mg  was sent into [facility].

## 2017-06-24 NOTE — Telephone Encounter (Signed)
Daughter states that she wants to go with Xarelto15mg  BID instead. States she will call back to see where she wants it sent.

## 2017-07-05 ENCOUNTER — Ambulatory Visit: Payer: Medicare Other | Admitting: Family Medicine

## 2017-07-06 ENCOUNTER — Telehealth: Payer: Self-pay | Admitting: Family Medicine

## 2017-07-06 NOTE — Telephone Encounter (Signed)
Patient states that she will think on it and let us know.

## 2017-07-06 NOTE — Telephone Encounter (Signed)
Notified patient that xarelto was only used to prevent stroke. She states that she understands that but is concerned that her numbness in getting worse and she isn't able to grip a pen like she did before. Wants to know what else you can suggest to help her. Please advise and route to [facility]

## 2017-07-06 NOTE — Telephone Encounter (Signed)
The xarelto is not meant to help with balance. It is meant t ohelp prevent a major stroke. Unfortunately the damage from the previous stroke, found on MRI, is permanent (as discussed in office recently),

## 2017-07-06 NOTE — Telephone Encounter (Signed)
lmtcb

## 2017-07-06 NOTE — Telephone Encounter (Signed)
Patient was placed on Xarelto and states it is not working for her. States that she is off balance worse, weakness and also the numbness that she had been having in bilateral feet has not moved up to her knees. Wanting to know if she can cut back on the Xarelto or be changed? Please advise and send if approved.

## 2017-07-06 NOTE — Telephone Encounter (Signed)
I can send to a different neurologist for a second opinion if desired. WS

## 2017-07-11 ENCOUNTER — Telehealth: Payer: Self-pay | Admitting: Family Medicine

## 2017-07-11 NOTE — Telephone Encounter (Signed)
Patient had a consult with a neurologist 05-25-2017.  She says Dr. Livia Snellen discussed helping her to see some one else for the numbness and tingling in feet and legs.  Please advise.

## 2017-07-11 NOTE — Telephone Encounter (Signed)
Please refer to another neurologist for a second opinion - that is what I offered.

## 2017-07-12 NOTE — Telephone Encounter (Signed)
Left message to call back  

## 2017-07-15 ENCOUNTER — Ambulatory Visit (INDEPENDENT_AMBULATORY_CARE_PROVIDER_SITE_OTHER): Payer: Medicare Other | Admitting: Family Medicine

## 2017-07-15 ENCOUNTER — Encounter: Payer: Self-pay | Admitting: Family Medicine

## 2017-07-15 VITALS — BP 185/94 | HR 90 | Temp 97.1°F | Ht 61.0 in | Wt 123.0 lb

## 2017-07-15 DIAGNOSIS — I6930 Unspecified sequelae of cerebral infarction: Secondary | ICD-10-CM

## 2017-07-15 DIAGNOSIS — I10 Essential (primary) hypertension: Secondary | ICD-10-CM

## 2017-07-15 DIAGNOSIS — R42 Dizziness and giddiness: Secondary | ICD-10-CM | POA: Diagnosis not present

## 2017-07-15 DIAGNOSIS — R2689 Other abnormalities of gait and mobility: Secondary | ICD-10-CM

## 2017-07-15 MED ORDER — AMLODIPINE BESYLATE 2.5 MG PO TABS
2.5000 mg | ORAL_TABLET | Freq: Every day | ORAL | 2 refills | Status: DC
Start: 2017-07-15 — End: 2017-09-26

## 2017-07-15 MED ORDER — RIVAROXABAN 20 MG PO TABS: 20.0000 mg | ORAL_TABLET | Freq: Every day | ORAL | 2 refills | Status: DC

## 2017-07-15 NOTE — Progress Notes (Signed)
Subjective:  Patient ID: Stacie Garza, female    DOB: [birth year]  Age: 83 y.o. MRN: [id number]  CC: Numbness (pt here today c/o leg numbness since July and now numbness in both hands)   HPI ARNETTE DRIGGS presents for concern that the dizziness is still ongoing. She had hoped that it would get better after starting the Xarelto.  She would like a second opinion from neurology regarding the cerebellar lacune and her dizziness. She has not had any side effects. She denies bleeding & bruising.   Depression screen [facility] 2/9 07/15/2017 06/23/2017 06/06/2017  Decreased Interest 0 0 0  Down, Depressed, Hopeless 0 0 0  PHQ - 2 Score 0 0 0    History Haven has a past medical history of Essential hypertension, Hyperlipidemia, Hypothyroidism, Thrombocytosis ([place]), and TIA (transient ischemic attack) (2004).   She has a past surgical history that includes Hernia repair and Abdominal hysterectomy.   Her family history includes Arthritis in her mother and sister; Cancer in her sister; Diabetes in her sister; Heart attack in her father; Heart disease in her brother and father.She reports that  has never smoked. she has never used smokeless tobacco. She reports that she does not drink alcohol or use drugs.    ROS Review of Systems  Constitutional: Negative for activity change, appetite change and fever.  HENT: Negative for congestion, rhinorrhea and sore throat.   Eyes: Negative for visual disturbance.  Respiratory: Negative for cough and shortness of breath.   Cardiovascular: Negative for chest pain and palpitations.  Gastrointestinal: Negative for abdominal pain, diarrhea and nausea.  Genitourinary: Negative for dysuria.  Musculoskeletal: Negative for arthralgias and myalgias.  Neurological: Positive for dizziness (off balance) and light-headedness.    Objective:  BP (!) 185/94   Pulse 90   Temp (!) 97.1 F (36.2 C) (Oral)   Ht 5\' 1"  (1.549 m)   Wt 123 lb (55.8 kg)   BMI 23.24 kg/m   BP  Readings from Last 3 Encounters:  07/15/17 (!) 185/94  06/23/17 (!) 166/97  06/06/17 (!) 187/96    Wt Readings from Last 3 Encounters:  07/15/17 123 lb (55.8 kg)  06/23/17 123 lb (55.8 kg)  06/06/17 126 lb (57.2 kg)     Physical Exam  Constitutional: She is oriented to person, place, and time. She appears well-developed and well-nourished. No distress.  HENT:  Head: Normocephalic and atraumatic.  Eyes: Conjunctivae are normal. Pupils are equal, round, and reactive to light.  Neck: Normal range of motion. Neck supple. No thyromegaly present.  Cardiovascular: Normal rate, regular rhythm and normal heart sounds.  No murmur heard. Pulmonary/Chest: Effort normal and breath sounds normal. No respiratory distress. She has no wheezes. She has no rales.  Abdominal: Soft. There is no tenderness.  Musculoskeletal: Normal range of motion.  Lymphadenopathy:    She has no cervical adenopathy.  Neurological: She is alert and oriented to person, place, and time.  Skin: Skin is warm and dry.  Psychiatric: She has a normal mood and affect. Her behavior is normal. Judgment and thought content normal.      Assessment & Plan:   Haevyn was seen today for numbness.  Diagnoses and all orders for this visit:  Dizziness  Imbalance  Essential hypertension  Other orders -     rivaroxaban (XARELTO) 20 MG TABS tablet; Take 1 tablet (20 mg total) by mouth daily with supper. -     amLODipine (NORVASC) 2.5 MG tablet; Take 1 tablet (2.5  mg total) by mouth daily.       I have discontinued Carter Kitten aspirin, Rivaroxaban, and Rivaroxaban. I am also having her start on rivaroxaban and amLODipine. Additionally, I am having her maintain her zinc gluconate, multivitamin-lutein, vitamin C, Polyethyl Glycol-Propyl Glycol, UNABLE TO FIND, levothyroxine, and olmesartan.  Allergies as of 07/15/2017      Reactions   Flaxseed (linseed) Itching, Other (See Comments), Rash   Blood in stool    Chlorthalidone Other (See Comments)   Dry eye (Eyelid stuck to eyeball)   Lavender Oil Other (See Comments)   Head felt dizzy and foggy, left arm pain, fatigue, off balance   Metoprolol Other (See Comments)   Headache,  Insomnia, Unstable gait, Lethargy, intermittent SOB.  (Last dose 07/24/2014)   Monascus Purpureus Went Yeast Other (See Comments), Nausea Only   Off balance, head felt heavy, aching in shoulders, fatigue, tightness in chest   Statins Other (See Comments)   Pain in back of head and neck, UTI, back pain   Typhoid Vaccines Other (See Comments)   redness   Atorvastatin    Bio-flax    Conjugated Estrogens    Ezetimibe    Simvastatin       Medication List        Accurate as of 07/15/17 11:59 PM. Always use your most recent med list.          amLODipine 2.5 MG tablet Commonly known as:  NORVASC Take 1 tablet (2.5 mg total) by mouth daily.   levothyroxine 150 MCG tablet Commonly known as:  SYNTHROID, LEVOTHROID Take 1 tablet (150 mcg total) by mouth as directed.   multivitamin-lutein Caps capsule Take by mouth.   olmesartan 40 MG tablet Commonly known as:  BENICAR Take 40 mg by mouth daily.   Polyethyl Glycol-Propyl Glycol 0.4-0.3 % Soln   rivaroxaban 20 MG Tabs tablet Commonly known as:  XARELTO Take 1 tablet (20 mg total) by mouth daily with supper.   UNABLE TO FIND Med Name: Garlic take one pill daily   vitamin C 500 MG tablet Commonly known as:  ASCORBIC ACID Take by mouth.   zinc gluconate 50 MG tablet Take by mouth.        Follow-up: No Follow-up on file.  Claretta Fraise, M.D.

## 2017-07-21 NOTE — Telephone Encounter (Signed)
Was seen 07/15/17.

## 2017-07-28 ENCOUNTER — Telehealth: Payer: Self-pay | Admitting: Family Medicine

## 2017-07-28 NOTE — Telephone Encounter (Signed)
Pt states she would like to go ahead and see the Neurologist you were talking about because she says her feet are getting more numb and it is harder for her to get around. Pt aware you are out of the office today but will return tomorrow. I thought it was probably a Neurologist in [place] so I wanted to wait until you got back to me about which Neurologist you would like her to see. Please advise.

## 2017-07-28 NOTE — Telephone Encounter (Signed)
Please Refer to [facility] in [facility], Dr. Sandrea Hammond. Thanks! WS

## 2017-07-29 ENCOUNTER — Encounter: Payer: Self-pay | Admitting: Neurology

## 2017-07-29 ENCOUNTER — Other Ambulatory Visit: Payer: Self-pay | Admitting: *Deleted

## 2017-07-29 DIAGNOSIS — R200 Anesthesia of skin: Secondary | ICD-10-CM

## 2017-07-29 NOTE — Telephone Encounter (Signed)
Aware. Referral done.

## 2017-08-02 ENCOUNTER — Telehealth: Payer: Self-pay | Admitting: Family Medicine

## 2017-08-02 NOTE — Telephone Encounter (Signed)
Pt wanting status of referral Pt notified notes were sent to [facility] for review Pt will call back if she does not hear from [facility] regarding appt

## 2017-08-03 ENCOUNTER — Ambulatory Visit: Payer: Medicare Other | Admitting: Neurology

## 2017-08-10 ENCOUNTER — Encounter: Payer: Self-pay | Admitting: Physical Therapy

## 2017-08-10 NOTE — Therapy (Signed)
[facility] [address] [place], [place], [address] Phone: [phone]   Fax:  [phone]  Patient Details  Name: MONETTA LICK MRN: [id number] Date of Birth: 1927-09-04 Referring Provider:  Wenda Low, MD  Encounter Date: 08/10/2017  PHYSICAL THERAPY DISCHARGE SUMMARY  Visits from Start of Care: 1  Current functional level related to goals / functional outcomes: Patient did not return after PT evaluation for any care.    Remaining deficits: Same as evaluation.    Education / Equipment: Deficits found at PT evaluation.  Plan: Patient agrees to discharge.  Patient goals were not met. Patient is being discharged due to not returning since the last visit.  ?????         Kristiann Noyce PT, DPT 08/10/2017, 12:02 PM  [facility] [address] [place], [place], [address] Phone: [phone]   Fax:  [phone]

## 2017-08-11 DIAGNOSIS — G603 Idiopathic progressive neuropathy: Secondary | ICD-10-CM | POA: Diagnosis not present

## 2017-08-11 DIAGNOSIS — R2681 Unsteadiness on feet: Secondary | ICD-10-CM | POA: Diagnosis not present

## 2017-08-11 DIAGNOSIS — I739 Peripheral vascular disease, unspecified: Secondary | ICD-10-CM | POA: Diagnosis not present

## 2017-08-12 ENCOUNTER — Other Ambulatory Visit: Payer: Medicare Other

## 2017-08-12 DIAGNOSIS — E538 Deficiency of other specified B group vitamins: Secondary | ICD-10-CM | POA: Diagnosis not present

## 2017-08-12 DIAGNOSIS — Z79899 Other long term (current) drug therapy: Secondary | ICD-10-CM | POA: Diagnosis not present

## 2017-08-12 DIAGNOSIS — G609 Hereditary and idiopathic neuropathy, unspecified: Secondary | ICD-10-CM | POA: Diagnosis not present

## 2017-08-12 DIAGNOSIS — E531 Pyridoxine deficiency: Secondary | ICD-10-CM | POA: Diagnosis not present

## 2017-08-12 DIAGNOSIS — E559 Vitamin D deficiency, unspecified: Secondary | ICD-10-CM | POA: Diagnosis not present

## 2017-08-13 ENCOUNTER — Other Ambulatory Visit: Payer: Self-pay | Admitting: Family Medicine

## 2017-08-13 DIAGNOSIS — G609 Hereditary and idiopathic neuropathy, unspecified: Secondary | ICD-10-CM

## 2017-08-13 DIAGNOSIS — E531 Pyridoxine deficiency: Secondary | ICD-10-CM

## 2017-08-13 DIAGNOSIS — E559 Vitamin D deficiency, unspecified: Secondary | ICD-10-CM

## 2017-08-13 DIAGNOSIS — E538 Deficiency of other specified B group vitamins: Secondary | ICD-10-CM

## 2017-08-16 ENCOUNTER — Encounter: Payer: Self-pay | Admitting: Family Medicine

## 2017-08-16 ENCOUNTER — Other Ambulatory Visit: Payer: Self-pay | Admitting: *Deleted

## 2017-08-16 ENCOUNTER — Ambulatory Visit: Payer: Medicare Other | Admitting: Family Medicine

## 2017-08-16 ENCOUNTER — Ambulatory Visit (INDEPENDENT_AMBULATORY_CARE_PROVIDER_SITE_OTHER): Payer: Medicare Other | Admitting: Family Medicine

## 2017-08-16 VITALS — BP 123/72 | HR 79 | Temp 96.7°F | Ht 61.0 in | Wt 121.0 lb

## 2017-08-16 DIAGNOSIS — R202 Paresthesia of skin: Secondary | ICD-10-CM

## 2017-08-16 DIAGNOSIS — R42 Dizziness and giddiness: Secondary | ICD-10-CM

## 2017-08-16 DIAGNOSIS — I10 Essential (primary) hypertension: Secondary | ICD-10-CM | POA: Diagnosis not present

## 2017-08-16 MED ORDER — SYNTHROID 150 MCG PO TABS: 150.0000 ug | ORAL_TABLET | Freq: Every day | ORAL | 1 refills | Status: DC

## 2017-08-16 NOTE — Progress Notes (Signed)
Subjective:  Patient ID: Stacie Garza, female    DOB: 02-Aug-1927  Age: 84 y.o. MRN: [id number]  CC: Follow-up (pt here today for 1 month follow up of her dizziness and numbness in feet and legs which pt is seeing Dr Berdine Addison for and has nerve conduction test scheduled for 08/22/17.)   HPI Stacie Garza presents for recheck of her blood pressure.  It is much better.  Medicine agrees with her.  No side effects from the Benicar noted.  She still has some numbness in her legs but it has stabilized.  The dizziness is a bit better.  She has an ongoing workup with neurology, Dr. at [place].  Multiple blood tests for vitamins contacted tissue diseases etc. is in the works with him and he is going to be doing a nerve conduction velocity for peripheral neuropathy in 6 days.  Depression screen [facility] 2/9 07/15/2017 06/23/2017 06/06/2017  Decreased Interest 0 0 0  Down, Depressed, Hopeless 0 0 0  PHQ - 2 Score 0 0 0    History Stacie Garza has a past medical history of Essential hypertension, Hyperlipidemia, Hypothyroidism, Thrombocytosis ([place]), and TIA (transient ischemic attack) (2004).   She has a past surgical history that includes Hernia repair and Abdominal hysterectomy.   Her family history includes Arthritis in her mother and sister; Cancer in her sister; Diabetes in her sister; Heart attack in her father; Heart disease in her brother and father.She reports that  has never smoked. she has never used smokeless tobacco. She reports that she does not drink alcohol or use drugs.    ROS Review of Systems  Constitutional: Negative for activity change, appetite change and fever.  HENT: Negative for congestion, rhinorrhea and sore throat.   Eyes: Negative for visual disturbance.  Respiratory: Negative for cough and shortness of breath.   Cardiovascular: Negative for chest pain and palpitations.  Gastrointestinal: Negative for abdominal pain, diarrhea and nausea.  Genitourinary: Negative for dysuria.    Musculoskeletal: Negative for arthralgias and myalgias.    Objective:  BP 123/72   Pulse 79   Temp (!) 96.7 F (35.9 C) (Oral)   Ht 5\' 1"  (1.549 m)   Wt 121 lb (54.9 kg)   BMI 22.86 kg/m   BP Readings from Last 3 Encounters:  08/16/17 123/72  07/15/17 (!) 185/94  06/23/17 (!) 166/97    Wt Readings from Last 3 Encounters:  08/16/17 121 lb (54.9 kg)  07/15/17 123 lb (55.8 kg)  06/23/17 123 lb (55.8 kg)     Physical Exam  Constitutional: She is oriented to person, place, and time. She appears well-developed and well-nourished. No distress.  HENT:  Head: Normocephalic and atraumatic.  Eyes: Conjunctivae are normal. Pupils are equal, round, and reactive to light.  Neck: Normal range of motion. Neck supple. No thyromegaly present.  Cardiovascular: Normal rate, regular rhythm and normal heart sounds.  No murmur heard. Pulmonary/Chest: Effort normal and breath sounds normal. No respiratory distress. She has no wheezes. She has no rales.  Abdominal: Soft. There is no tenderness.  Musculoskeletal: Normal range of motion.  Lymphadenopathy:    She has no cervical adenopathy.  Neurological: She is alert and oriented to person, place, and time.  Skin: Skin is warm and dry.  Psychiatric: She has a normal mood and affect. Her behavior is normal.      Assessment & Plan:   Jessah was seen today for follow-up.  Diagnoses and all orders for this visit:  Essential hypertension  Dizziness and giddiness  Paresthesia       I am having Stacie Garza maintain her zinc gluconate, multivitamin-lutein, vitamin C, Polyethyl Glycol-Propyl Glycol, UNABLE TO FIND, olmesartan, rivaroxaban, amLODipine, and SYNTHROID.  Allergies as of 08/16/2017      Reactions   Flaxseed (linseed) Itching, Other (See Comments), Rash   Blood in stool   Chlorthalidone Other (See Comments)   Dry eye (Eyelid stuck to eyeball)   Lavender Oil Other (See Comments)   Head felt dizzy and foggy, left arm  pain, fatigue, off balance   Metoprolol Other (See Comments)   Headache,  Insomnia, Unstable gait, Lethargy, intermittent SOB.  (Last dose 07/24/2014)   Monascus Purpureus Went Yeast Other (See Comments), Nausea Only   Off balance, head felt heavy, aching in shoulders, fatigue, tightness in chest   Statins Other (See Comments)   Pain in back of head and neck, UTI, back pain   Typhoid Vaccines Other (See Comments)   redness   Atorvastatin    Bio-flax    Conjugated Estrogens    Ezetimibe    Simvastatin       Medication List        Accurate as of 08/16/17  6:34 PM. Always use your most recent med list.          amLODipine 2.5 MG tablet Commonly known as:  NORVASC Take 1 tablet (2.5 mg total) by mouth daily.   multivitamin-lutein Caps capsule Take by mouth.   olmesartan 40 MG tablet Commonly known as:  BENICAR Take 40 mg by mouth daily.   Polyethyl Glycol-Propyl Glycol 0.4-0.3 % Soln   rivaroxaban 20 MG Tabs tablet Commonly known as:  XARELTO Take 1 tablet (20 mg total) by mouth daily with supper.   SYNTHROID 150 MCG tablet Generic drug:  levothyroxine Take 1 tablet (150 mcg total) by mouth daily before breakfast.   UNABLE TO FIND Med Name: Garlic take one pill daily   vitamin C 500 MG tablet Commonly known as:  ASCORBIC ACID Take by mouth.   zinc gluconate 50 MG tablet Take by mouth.        Follow-up: Return in about 3 months (around 11/14/2017).  Claretta Fraise, M.D.

## 2017-08-22 ENCOUNTER — Encounter: Payer: Self-pay | Admitting: Family Medicine

## 2017-08-22 DIAGNOSIS — G603 Idiopathic progressive neuropathy: Secondary | ICD-10-CM | POA: Diagnosis not present

## 2017-08-22 DIAGNOSIS — I739 Peripheral vascular disease, unspecified: Secondary | ICD-10-CM | POA: Diagnosis not present

## 2017-08-22 DIAGNOSIS — R2681 Unsteadiness on feet: Secondary | ICD-10-CM | POA: Diagnosis not present

## 2017-08-24 ENCOUNTER — Telehealth: Payer: Self-pay | Admitting: Family Medicine

## 2017-08-24 NOTE — Telephone Encounter (Signed)
Called patient - requesting to only talk to Stacie Garza.

## 2017-08-24 NOTE — Telephone Encounter (Signed)
Pt saw Dr hill and he wanted her to get B12 shots and gave her a written rx. Pt wanted to come here to get the B12 shots so appt given with nurse Friday 2/1 and advised to bring the rx with her. Pt voiced understanding. Dr Livia Snellen ok with it.

## 2017-08-26 ENCOUNTER — Ambulatory Visit (INDEPENDENT_AMBULATORY_CARE_PROVIDER_SITE_OTHER): Payer: Medicare Other | Admitting: *Deleted

## 2017-08-26 DIAGNOSIS — E538 Deficiency of other specified B group vitamins: Secondary | ICD-10-CM

## 2017-08-26 MED ORDER — CYANOCOBALAMIN 1000 MCG/ML IJ SOLN
1000.0000 ug | INTRAMUSCULAR | Status: AC
  Administered 2017-08-26 – 2017-09-16 (×4): 1000 ug via INTRAMUSCULAR

## 2017-08-26 NOTE — Progress Notes (Signed)
Pt given Cyanocobalamin inj Tolerated well 

## 2017-08-30 DIAGNOSIS — L723 Sebaceous cyst: Secondary | ICD-10-CM | POA: Diagnosis not present

## 2017-08-30 DIAGNOSIS — Z23 Encounter for immunization: Secondary | ICD-10-CM | POA: Diagnosis not present

## 2017-08-30 DIAGNOSIS — Z85828 Personal history of other malignant neoplasm of skin: Secondary | ICD-10-CM | POA: Diagnosis not present

## 2017-08-30 DIAGNOSIS — Z808 Family history of malignant neoplasm of other organs or systems: Secondary | ICD-10-CM | POA: Diagnosis not present

## 2017-08-30 DIAGNOSIS — L821 Other seborrheic keratosis: Secondary | ICD-10-CM | POA: Diagnosis not present

## 2017-09-02 ENCOUNTER — Ambulatory Visit (INDEPENDENT_AMBULATORY_CARE_PROVIDER_SITE_OTHER): Payer: Medicare Other | Admitting: *Deleted

## 2017-09-02 DIAGNOSIS — E538 Deficiency of other specified B group vitamins: Secondary | ICD-10-CM | POA: Diagnosis not present

## 2017-09-02 NOTE — Progress Notes (Signed)
Pt given Cyanocobalamin inj Tolerated well 

## 2017-09-05 DIAGNOSIS — H35371 Puckering of macula, right eye: Secondary | ICD-10-CM | POA: Diagnosis not present

## 2017-09-05 DIAGNOSIS — H52203 Unspecified astigmatism, bilateral: Secondary | ICD-10-CM | POA: Diagnosis not present

## 2017-09-05 DIAGNOSIS — Z961 Presence of intraocular lens: Secondary | ICD-10-CM | POA: Diagnosis not present

## 2017-09-09 ENCOUNTER — Ambulatory Visit (INDEPENDENT_AMBULATORY_CARE_PROVIDER_SITE_OTHER): Payer: Medicare Other | Admitting: *Deleted

## 2017-09-09 DIAGNOSIS — E538 Deficiency of other specified B group vitamins: Secondary | ICD-10-CM | POA: Diagnosis not present

## 2017-09-09 NOTE — Progress Notes (Signed)
Pt given Cyanocobalamin inj Tolerated well 

## 2017-09-12 ENCOUNTER — Other Ambulatory Visit: Payer: Self-pay | Admitting: Family Medicine

## 2017-09-12 ENCOUNTER — Telehealth: Payer: Self-pay

## 2017-09-12 MED ORDER — IRBESARTAN 300 MG PO TABS: 300.0000 mg | ORAL_TABLET | Freq: Every day | ORAL | 1 refills | Status: DC

## 2017-09-12 NOTE — Telephone Encounter (Signed)
Can't get Benicar  Can you change to something else?

## 2017-09-14 ENCOUNTER — Other Ambulatory Visit: Payer: Self-pay | Admitting: Family Medicine

## 2017-09-16 ENCOUNTER — Ambulatory Visit (INDEPENDENT_AMBULATORY_CARE_PROVIDER_SITE_OTHER): Payer: Medicare Other | Admitting: *Deleted

## 2017-09-16 DIAGNOSIS — E538 Deficiency of other specified B group vitamins: Secondary | ICD-10-CM

## 2017-09-16 NOTE — Progress Notes (Signed)
Pt given Cyanocobalamin inj Tolerated well 

## 2017-09-26 ENCOUNTER — Other Ambulatory Visit: Payer: Self-pay | Admitting: Family Medicine

## 2017-10-11 ENCOUNTER — Other Ambulatory Visit: Payer: Self-pay | Admitting: *Deleted

## 2017-10-11 MED ORDER — IRBESARTAN 300 MG PO TABS: 300.0000 mg | ORAL_TABLET | Freq: Every day | ORAL | 1 refills | Status: DC

## 2017-10-14 ENCOUNTER — Ambulatory Visit (INDEPENDENT_AMBULATORY_CARE_PROVIDER_SITE_OTHER): Payer: Medicare Other | Admitting: *Deleted

## 2017-10-14 DIAGNOSIS — E538 Deficiency of other specified B group vitamins: Secondary | ICD-10-CM

## 2017-10-14 DIAGNOSIS — G629 Polyneuropathy, unspecified: Secondary | ICD-10-CM

## 2017-10-14 MED ORDER — CYANOCOBALAMIN 1000 MCG/ML IJ SOLN
1000.0000 ug | INTRAMUSCULAR | Status: AC
  Administered 2017-10-14 – 2018-07-14 (×10): 1000 ug via INTRAMUSCULAR

## 2017-10-14 NOTE — Progress Notes (Signed)
Pt given cyanocobalamin inj Tolerated well 

## 2017-10-27 ENCOUNTER — Other Ambulatory Visit: Payer: Self-pay | Admitting: Family Medicine

## 2017-10-28 ENCOUNTER — Ambulatory Visit: Payer: Medicare Other | Admitting: Neurology

## 2017-11-03 ENCOUNTER — Other Ambulatory Visit: Payer: Self-pay | Admitting: *Deleted

## 2017-11-17 DIAGNOSIS — R2681 Unsteadiness on feet: Secondary | ICD-10-CM | POA: Diagnosis not present

## 2017-11-17 DIAGNOSIS — G603 Idiopathic progressive neuropathy: Secondary | ICD-10-CM | POA: Diagnosis not present

## 2017-11-17 DIAGNOSIS — I739 Peripheral vascular disease, unspecified: Secondary | ICD-10-CM | POA: Diagnosis not present

## 2017-11-18 ENCOUNTER — Telehealth: Payer: Self-pay | Admitting: Hematology

## 2017-11-18 ENCOUNTER — Ambulatory Visit (INDEPENDENT_AMBULATORY_CARE_PROVIDER_SITE_OTHER): Payer: Medicare Other | Admitting: *Deleted

## 2017-11-18 DIAGNOSIS — G629 Polyneuropathy, unspecified: Secondary | ICD-10-CM

## 2017-11-18 DIAGNOSIS — E538 Deficiency of other specified B group vitamins: Secondary | ICD-10-CM

## 2017-11-18 NOTE — Progress Notes (Signed)
Pt given Cyanocobalamin inj Tolerated well 

## 2017-11-18 NOTE — Telephone Encounter (Signed)
Patient called to cancel °

## 2017-11-21 ENCOUNTER — Inpatient Hospital Stay: Payer: Medicare Other | Admitting: Hematology

## 2017-11-21 ENCOUNTER — Inpatient Hospital Stay: Payer: Medicare Other

## 2017-12-06 ENCOUNTER — Telehealth: Payer: Self-pay | Admitting: *Deleted

## 2017-12-06 NOTE — Telephone Encounter (Signed)
Fax received [facility] Refill 90 day supply Olmesartan 40 mg Not on current med list Please advise

## 2017-12-06 NOTE — Telephone Encounter (Signed)
She can have that OR irbesartan. Which does pharmacy have. Which does she prefer? WS

## 2017-12-16 ENCOUNTER — Ambulatory Visit (INDEPENDENT_AMBULATORY_CARE_PROVIDER_SITE_OTHER): Payer: Medicare Other | Admitting: *Deleted

## 2017-12-16 DIAGNOSIS — G629 Polyneuropathy, unspecified: Secondary | ICD-10-CM

## 2017-12-16 DIAGNOSIS — E538 Deficiency of other specified B group vitamins: Secondary | ICD-10-CM

## 2017-12-16 NOTE — Progress Notes (Signed)
Vitamin b12 injection given and patient tolerated well.  

## 2017-12-22 ENCOUNTER — Other Ambulatory Visit: Payer: Self-pay | Admitting: Family Medicine

## 2017-12-26 ENCOUNTER — Other Ambulatory Visit: Payer: Self-pay | Admitting: Family Medicine

## 2018-01-04 NOTE — Telephone Encounter (Signed)
Patient seen since phone call.  This encounter will now be closed 

## 2018-01-19 ENCOUNTER — Ambulatory Visit (INDEPENDENT_AMBULATORY_CARE_PROVIDER_SITE_OTHER): Payer: Medicare Other | Admitting: *Deleted

## 2018-01-19 DIAGNOSIS — E538 Deficiency of other specified B group vitamins: Secondary | ICD-10-CM | POA: Diagnosis not present

## 2018-01-19 NOTE — Progress Notes (Signed)
Pt given cyanocobalamin inj Tolerated well 

## 2018-01-20 DIAGNOSIS — E559 Vitamin D deficiency, unspecified: Secondary | ICD-10-CM | POA: Diagnosis not present

## 2018-01-20 DIAGNOSIS — E538 Deficiency of other specified B group vitamins: Secondary | ICD-10-CM | POA: Diagnosis not present

## 2018-01-20 DIAGNOSIS — G603 Idiopathic progressive neuropathy: Secondary | ICD-10-CM | POA: Diagnosis not present

## 2018-01-20 DIAGNOSIS — R2681 Unsteadiness on feet: Secondary | ICD-10-CM | POA: Diagnosis not present

## 2018-02-01 ENCOUNTER — Other Ambulatory Visit: Payer: Self-pay | Admitting: Family Medicine

## 2018-02-01 DIAGNOSIS — G459 Transient cerebral ischemic attack, unspecified: Secondary | ICD-10-CM

## 2018-02-01 NOTE — Telephone Encounter (Signed)
Pt. Was seen by neurology. He suggests cardiology evaluation for latent paroxysmal a. Fib. Referral ordered. Please check with pt. To see if she is willing to go. Thanks, WS

## 2018-02-07 ENCOUNTER — Telehealth: Payer: Self-pay | Admitting: Family Medicine

## 2018-02-07 DIAGNOSIS — R2689 Other abnormalities of gait and mobility: Secondary | ICD-10-CM

## 2018-02-07 NOTE — Telephone Encounter (Signed)
Please  write and I will sign. Thanks, WS 

## 2018-02-07 NOTE — Telephone Encounter (Signed)
Please review and advise.

## 2018-02-08 NOTE — Telephone Encounter (Signed)
Rx for wheelchair printed and signed. Faxed over to [facility] per pt request and pt is aware.

## 2018-02-12 NOTE — Progress Notes (Signed)
Cardiology Office Note   Date:  02/14/2018   ID:  Stacie Garza, DOB [birth year], MRN [id number]  PCP:  Stacie Fraise, MD  Cardiologist:   No primary care provider on file. Referring:  Stacie Fraise, MD  Chief Complaint  Patient presents with  . Transient Ischemic Attack      History of Present Illness: Stacie Garza is a 84 y.o. female who was seen by Dr. Domenic Polite previously for evaluation of mild carotid artery plaque.  I see mention of a negative Lexiscan Myoview and [facility] in 2016.  She requested follow up given her mild plaque in the past and a reported TIA last year.    She has had a complicated history with multiple TIAs by her report starting around 2015.  She has had thrombocytosis and has been seen and I was able to review some records for this.  She was on high-dose aspirin at one point in time.  Because of TIAs she was started on Xarelto but she says she had severe reactions to this including constipation and around that time late last year earlier this year she became profoundly weak.  She is been able to do her activities walking through her house doing her chores and now she is barely able to stand with a walker and ambulate.  She is seen neurology and there is been no clear etiology.  She was sent here because it was a question apparently whether there could be dysrhythmia like atrial fibrillation that might explain some of the TIAs and potentially the neurologic decline.  She does not report palpitations, presyncope or syncope.  She does not have chest pressure, neck or arm discomfort.  She has no shortness of breath, PND or orthopnea.  She is a retired [employer] scientist.   Past Medical History:  Diagnosis Date  . Essential hypertension   . Hyperlipidemia   . Hypothyroidism   . Thrombocytosis ([place])   . TIA (transient ischemic attack) 2004    Past Surgical History:  Procedure Laterality Date  . ABDOMINAL HYSTERECTOMY    . HERNIA REPAIR       Current  Outpatient Medications  Medication Sig Dispense Refill  . aspirin 325 MG tablet Take 325 mg by mouth 2 (two) times daily.    Marland Kitchen gabapentin (NEURONTIN) 100 MG capsule Take 100 mg by mouth daily as needed.  4  . multivitamin-lutein (OCUVITE-LUTEIN) CAPS capsule Take by mouth.    . olmesartan (BENICAR) 40 MG tablet Take 40 mg by mouth daily.  2  . SYNTHROID 150 MCG tablet Take 1 tablet (150 mcg total) by mouth daily before breakfast. 90 tablet 1  . UNABLE TO FIND Med Name: Garlic take one pill daily    . vitamin C (ASCORBIC ACID) 500 MG tablet Take by mouth.    . Vitamin D, Ergocalciferol, (DRISDOL) 50000 units CAPS capsule Take 50,000 Units by mouth once a week.  5  . zinc gluconate 50 MG tablet Take by mouth.    [name] Glycol-Propyl Glycol 0.4-0.3 % SOLN      Current Facility-Administered Medications  Medication Dose Route Frequency Provider Last Rate Last Dose  . cyanocobalamin ((VITAMIN B-12)) injection 1,000 mcg  1,000 mcg Intramuscular Q30 days Stacie Fraise, MD   1,000 mcg at 01/19/18 1046    Allergies:   Flax seed  [linseed oil]; Flaxseed (linseed); Atorvastatin; Chlorthalidone; Conjugated estrogens; Ezetimibe; Lavender oil; Metoprolol; Monascus purpureus went yeast; Simvastatin; Statins; Typhoid vaccines; Bio-flax; and Prenatal vit-fe psac cmplx-fa  ROS:  Please see the history of present illness.   Otherwise, review of systems are positive for none.   All other systems are reviewed and negative.    PHYSICAL EXAM: VS:  BP 134/72   Pulse 85   Ht 5\' 1"  (1.549 m)   Wt 105 lb (47.6 kg)   SpO2 92%   BMI 19.84 kg/m  , BMI Body mass index is 19.84 kg/m. PHYSICAL EXAM GEN:  No distress, frail appearing NECK:  No jugular venous distention at 90 degrees, waveform within normal limits, carotid upstroke brisk and symmetric, no bruits, no thyromegaly LYMPHATICS:  No cervical adenopathy LUNGS:  Clear to auscultation bilaterally BACK:  No CVA tenderness CHEST:   Unremarkable HEART:  S1 and S2 within normal limits, no S3, no S4, no clicks, no rubs, no murmurs ABD:  Positive bowel sounds normal in frequency in pitch, no bruits, no rebound, no guarding, unable to assess midline mass or bruit with the patient seated. EXT:  2 plus pulses throughout, moderate edema, no cyanosis no clubbing SKIN:  No rashes no nodules NEURO:  Cranial nerves II through XII grossly intact, motor grossly intact throughout PSYCH:  Cognitively intact, oriented to person place and time   EKG:  EKG is ordered today. The ekg ordered today demonstrates sinus rhythm, rate 85,  left axis deviation, early transition in V2, no acute ST-T wave changes.   Recent Labs: 05/06/2017: TSH 0.064 05/25/2017: ALT 14; BUN 14.5; Creatinine 0.8; HGB 13.8; Platelets 556; Potassium 3.9; Sodium 140    Lipid Panel    Component Value Date/Time   CHOL 206 (H) 05/06/2017 1656   TRIG 145 05/06/2017 1656   HDL 43 05/06/2017 1656   CHOLHDL 4.8 (H) 05/06/2017 1656   LDLCALC 134 (H) 05/06/2017 1656      Wt Readings from Last 3 Encounters:  02/14/18 105 lb (47.6 kg)  08/16/17 121 lb (54.9 kg)  07/15/17 123 lb (55.8 kg)      Other studies Reviewed: Additional studies/ records that were reviewed today include: Extensive office records and hospital records including neurology visits .   (Greater than 40 minutes reviewing all data with greater than 50% face to face with the patient). Review of the above records demonstrates:  Please see elsewhere in the note.     ASSESSMENT AND PLAN:  CAROTID STENOSIS:  This was mild in 2018.   This is to be followed up tomorrow with carotid Doppler and I will follow-up these results., previous cardiology visits.  HTN: The blood pressure is at target. No change in medications is indicated. We will continue with therapeutic lifestyle changes (TLC).  TIA:  I will apply an event monitor to look for atrial fib.  WEAKNESS: I have suggested to her the possibility  of a second neurology opinion given the weakness.  I am going to check a CBC and TSH today.   Current medicines are reviewed at length with the patient today.  The patient does not have concerns regarding medicines.  The following changes have been made:  no change  Labs/ tests ordered today include:   Orders Placed This Encounter  Procedures  . TSH  . CBC  . CARDIAC EVENT MONITOR  . EKG 12-Lead     Disposition:   FU with me after the monitor.      Signed, Minus Breeding, MD  02/14/2018 10:45 AM    [place]

## 2018-02-14 ENCOUNTER — Encounter: Payer: Self-pay | Admitting: Cardiology

## 2018-02-14 ENCOUNTER — Ambulatory Visit (INDEPENDENT_AMBULATORY_CARE_PROVIDER_SITE_OTHER): Payer: Medicare Other | Admitting: Cardiology

## 2018-02-14 ENCOUNTER — Encounter: Payer: Self-pay | Admitting: Family Medicine

## 2018-02-14 ENCOUNTER — Ambulatory Visit (INDEPENDENT_AMBULATORY_CARE_PROVIDER_SITE_OTHER): Payer: Medicare Other | Admitting: Family Medicine

## 2018-02-14 VITALS — BP 134/72 | HR 85 | Ht 61.0 in | Wt 105.0 lb

## 2018-02-14 VITALS — BP 152/92 | HR 112 | Temp 97.5°F | Ht 61.0 in | Wt 105.0 lb

## 2018-02-14 DIAGNOSIS — R5383 Other fatigue: Secondary | ICD-10-CM

## 2018-02-14 DIAGNOSIS — Z79899 Other long term (current) drug therapy: Secondary | ICD-10-CM | POA: Diagnosis not present

## 2018-02-14 DIAGNOSIS — G459 Transient cerebral ischemic attack, unspecified: Secondary | ICD-10-CM | POA: Insufficient documentation

## 2018-02-14 DIAGNOSIS — E785 Hyperlipidemia, unspecified: Secondary | ICD-10-CM | POA: Diagnosis not present

## 2018-02-14 DIAGNOSIS — R002 Palpitations: Secondary | ICD-10-CM | POA: Diagnosis not present

## 2018-02-14 DIAGNOSIS — E039 Hypothyroidism, unspecified: Secondary | ICD-10-CM | POA: Diagnosis not present

## 2018-02-14 DIAGNOSIS — R200 Anesthesia of skin: Secondary | ICD-10-CM

## 2018-02-14 DIAGNOSIS — R531 Weakness: Secondary | ICD-10-CM | POA: Diagnosis not present

## 2018-02-14 DIAGNOSIS — E538 Deficiency of other specified B group vitamins: Secondary | ICD-10-CM

## 2018-02-14 DIAGNOSIS — G629 Polyneuropathy, unspecified: Secondary | ICD-10-CM | POA: Diagnosis not present

## 2018-02-14 MED ORDER — SYNTHROID 150 MCG PO TABS: 150.0000 ug | ORAL_TABLET | Freq: Every day | ORAL | 1 refills | Status: AC

## 2018-02-14 NOTE — Patient Instructions (Addendum)
Medication Instructions:  Continue current medications  If you need a refill on your cardiac medications before your next appointment, please call your pharmacy.  Labwork: CBC and TSH HERE IN OUR OFFICE AT [facility]  Take the provided lab slips with you to the lab for your blood draw.   You will NOT need to fast   Testing/Procedures: Your physician has recommended that you wear an event monitor 21 days monitor to be put on at Dr Livia Snellen office in [place]. Event monitors are medical devices that record the heart's electrical activity. Doctors most often [place] these monitors to diagnose arrhythmias. Arrhythmias are problems with the speed or rhythm of the heartbeat. The monitor is a small, portable device. You can wear one while you do your normal daily activities. This is usually used to diagnose what is causing palpitations/syncope (passing out).   Follow-Up: Your physician wants you to follow-up in: 1 Month.      Thank you for choosing [facility] at [facility]!!

## 2018-02-14 NOTE — Progress Notes (Signed)
Subjective:  Patient ID: Stacie Garza, female    DOB: [birth year]  Age: 84 y.o. MRN: [id number]  CC: Medical Management of Chronic Issues   HPI Stacie Garza presents for patient continues to be weak all over as well.  This is causing her to be very unsteady.  She is able to walk briefly with a walker.  However she has fallen backwards.  Therefore she needs a wheelchair for movement around the home particularly when she is unsupervised.  She has been diagnosed with neuropathy by Dr. at [facility] with neurology.  She is noted to have had multiple lacunar infarctions as well.  She has seen Dr. Percival Spanish this morning who has recommended that she had a multiple day event monitor up to 21 days to determine if she has paroxysmal atrial fibrillation as the source of her infarctions.  Of note is that she takes Xarelto to prevent any further infarction ever since onset of her condition several months ago.  She is very concerned that the onset of this was quite acute several months ago and she still does not understand why.  Of note is that she has had a 12 level that was rather low and wants to have that checked today.  Dr. Berdine Addison feels that because of her numbness and tingling that she should have a B12 level with a  floor of 400.  She is taking oral B12 now but if 400 has not been obtained then he wants her to go to injections.  Additionally the patient has had low ferritin levels indicating low iron stores in the past she would like to be checked for iron deficiency via ferritin level today.  Patient presents for follow-up on  thyroid. The patient has a history of hypothyroidism for many years. It has been stable recently. Pt. denies any change in  voice, loss of hair, heat or cold intolerance. Energy level has been adequate to good. Patient denies constipation and diarrhea. No myxedema. Medication is as noted below. Verified that pt is taking it daily on an empty stomach. Well tolerated. Depression screen [facility] 2/9  07/15/2017 06/23/2017 06/06/2017  Decreased Interest 0 0 0  Down, Depressed, Hopeless 0 0 0  PHQ - 2 Score 0 0 0    History Joeanna has a past medical history of Essential hypertension, Hyperlipidemia, Hypothyroidism, Thrombocytosis ([place]), and TIA (transient ischemic attack) (2004).   She has a past surgical history that includes Hernia repair and Abdominal hysterectomy.   Her family history includes Arthritis in her mother and sister; Cancer in her sister; Diabetes in her sister; Heart attack in her father; Heart disease in her brother and father.She reports that she has never smoked. She has never used smokeless tobacco. She reports that she does not drink alcohol or use drugs.    ROS Review of Systems  Constitutional: Positive for activity change.  HENT: Negative.   Eyes: Negative for visual disturbance.  Respiratory: Negative for shortness of breath.   Cardiovascular: Negative for chest pain.  Gastrointestinal: Negative for abdominal pain.  Musculoskeletal: Negative for arthralgias.  Neurological: Positive for weakness and numbness.    Objective:  Ht 5\' 1"  (1.549 m)   Wt 105 lb (47.6 kg)   BMI 19.84 kg/m   BP Readings from Last 3 Encounters:  02/14/18 134/72  08/16/17 123/72  07/15/17 (!) 185/94    Wt Readings from Last 3 Encounters:  02/14/18 105 lb (47.6 kg)  02/14/18 105 lb (47.6 kg)  08/16/17 121 lb (  54.9 kg)     Physical Exam  Constitutional: She is oriented to person, place, and time. She appears well-developed and well-nourished. No distress.  HENT:  Head: Normocephalic and atraumatic.  Eyes: Pupils are equal, round, and reactive to light. Conjunctivae are normal.  Neck: Normal range of motion. Neck supple. No thyromegaly present.  Cardiovascular: Normal rate, regular rhythm and normal heart sounds.  No murmur heard. Pulmonary/Chest: Effort normal and breath sounds normal. No respiratory distress. She has no wheezes. She has no rales.  Abdominal: Soft.  Bowel sounds are normal. She exhibits no distension. There is no tenderness.  Musculoskeletal:  Range of motion normal for upper extremities.  Her coordination is adequate.  However she is weak 4/5 strength.  Her lower extremities are weak as well.  There is no focal deficit.  Lymphadenopathy:    She has no cervical adenopathy.  Neurological: She is alert and oriented to person, place, and time.  Skin: Skin is warm and dry.  Psychiatric: She has a normal mood and affect. Her behavior is normal. Judgment and thought content normal.      Assessment & Plan:   There are no diagnoses linked to this encounter.     I am having Stacie Garza maintain her zinc gluconate, multivitamin-lutein, vitamin C, Polyethyl Glycol-Propyl Glycol, UNABLE TO FIND, SYNTHROID, Vitamin D (Ergocalciferol), aspirin, gabapentin, and olmesartan. We will continue to administer cyanocobalamin.  Allergies as of 02/14/2018      Reactions   Flax Seed  [linseed Oil] Itching, Rash   Blood in stool   Flaxseed (linseed) Itching, Other (See Comments), Rash   Blood in stool   Atorvastatin Other (See Comments)   Back pain   Chlorthalidone Other (See Comments)   Dry eye (Eyelid stuck to eyeball)   Conjugated Estrogens Other (See Comments)   Can't remember reaction   Ezetimibe Other (See Comments)   Dizziness, Fatigue, numbness in hands and arms, irregular heart beat, tightness in chest   Lavender Oil Other (See Comments)   Dizziness, Head felt dizzy and foggy, left arm pain, fatigue, off balance   Metoprolol Other (See Comments)   Headache,  Insomnia, Unstable gait, Lethargy, intermittent SOB.  (Last dose 07/24/2014)   Monascus Purpureus Went Yeast Nausea Only, Other (See Comments)   Off balance, head felt heavy, aching in shoulders, fatigue, tightness in chest   Simvastatin    Pain in back of head and neck   Statins Other (See Comments)   Bladder infections Pain in back of head and neck, UTI, back pain   Typhoid  Vaccines Other (See Comments)   redness   Bio-flax    Prenatal Vit-fe Psac Cmplx-fa    Constipation with all Iron      Medication List        Accurate as of 02/14/18  1:07 PM. Always use your most recent med list.          aspirin 325 MG tablet Take 325 mg by mouth 2 (two) times daily.   gabapentin 100 MG capsule Commonly known as:  NEURONTIN Take 100 mg by mouth daily as needed.   multivitamin-lutein Caps capsule Take by mouth.   olmesartan 40 MG tablet Commonly known as:  BENICAR Take 40 mg by mouth daily.   Polyethyl Glycol-Propyl Glycol 0.4-0.3 % Soln   SYNTHROID 150 MCG tablet Generic drug:  levothyroxine Take 1 tablet (150 mcg total) by mouth daily before breakfast.   UNABLE TO FIND Med Name: Garlic take one pill daily  vitamin C 500 MG tablet Commonly known as:  ASCORBIC ACID Take by mouth.   Vitamin D (Ergocalciferol) 50000 units Caps capsule Commonly known as:  DRISDOL Take 50,000 Units by mouth once a week.   zinc gluconate 50 MG tablet Take by mouth.        Follow-up: No follow-ups on file.  Claretta Fraise, M.D.

## 2018-02-15 ENCOUNTER — Ambulatory Visit (INDEPENDENT_AMBULATORY_CARE_PROVIDER_SITE_OTHER): Payer: Medicare Other

## 2018-02-15 DIAGNOSIS — I779 Disorder of arteries and arterioles, unspecified: Secondary | ICD-10-CM

## 2018-02-15 DIAGNOSIS — I739 Peripheral vascular disease, unspecified: Principal | ICD-10-CM

## 2018-02-15 LAB — LIPID PANEL
CHOLESTEROL TOTAL: 212 mg/dL — AB (ref 100–199)
Chol/HDL Ratio: 4.5 ratio — ABNORMAL HIGH (ref 0.0–4.4)
HDL: 47 mg/dL (ref >39)
LDL Calculated: 142 mg/dL — ABNORMAL HIGH (ref 0–99)
TRIGLYCERIDES: 116 mg/dL (ref 0–149)
VLDL Cholesterol Cal: 23 mg/dL (ref 5–40)

## 2018-02-15 LAB — TSH: TSH: 0.282 u[IU]/mL — ABNORMAL LOW (ref 0.450–4.500)

## 2018-02-15 LAB — CMP14+EGFR
ALK PHOS: 98 IU/L (ref 39–117)
ALT: 10 IU/L (ref 0–32)
AST: 13 IU/L (ref 0–40)
Albumin/Globulin Ratio: 1.6 (ref 1.2–2.2)
Albumin: 3.9 g/dL (ref 3.5–4.7)
BUN/Creatinine Ratio: 23 (ref 12–28)
BUN: 18 mg/dL (ref 8–27)
Bilirubin Total: 0.6 mg/dL (ref 0.0–1.2)
CO2: 23 mmol/L (ref 20–29)
CREATININE: 0.77 mg/dL (ref 0.57–1.00)
Calcium: 9.2 mg/dL (ref 8.7–10.3)
Chloride: 105 mmol/L (ref 96–106)
GFR calc Af Amer: 80 mL/min/{1.73_m2} (ref >59)
GFR calc non Af Amer: 70 mL/min/{1.73_m2} (ref >59)
GLUCOSE: 138 mg/dL — AB (ref 65–99)
Globulin, Total: 2.5 g/dL (ref 1.5–4.5)
Potassium: 4.5 mmol/L (ref 3.5–5.2)
SODIUM: 143 mmol/L (ref 134–144)
Total Protein: 6.4 g/dL (ref 6.0–8.5)

## 2018-02-15 LAB — VITAMIN B12: Vitamin B-12: 506 pg/mL (ref 232–1245)

## 2018-02-15 LAB — CBC
Hematocrit: 42.6 % (ref 34.0–46.6)
Hemoglobin: 14.1 g/dL (ref 11.1–15.9)
MCH: 32.6 pg (ref 26.6–33.0)
MCHC: 33.1 g/dL (ref 31.5–35.7)
MCV: 99 fL — ABNORMAL HIGH (ref 79–97)
PLATELETS: 722 10*3/uL — AB (ref 150–450)
RBC: 4.32 x10E6/uL (ref 3.77–5.28)
RDW: 13.4 % (ref 12.3–15.4)
WBC: 5.9 10*3/uL (ref 3.4–10.8)

## 2018-02-15 LAB — FOLATE: Folate: 5.6 ng/mL (ref >3.0)

## 2018-02-15 LAB — T4, FREE: Free T4: 1.57 ng/dL (ref 0.82–1.77)

## 2018-02-15 LAB — SEDIMENTATION RATE: SED RATE: 11 mm/h (ref 0–40)

## 2018-02-16 ENCOUNTER — Telehealth: Payer: Self-pay | Admitting: Family Medicine

## 2018-02-16 ENCOUNTER — Telehealth: Payer: Self-pay | Admitting: *Deleted

## 2018-02-16 ENCOUNTER — Telehealth: Payer: Self-pay | Admitting: Cardiology

## 2018-02-16 DIAGNOSIS — R200 Anesthesia of skin: Secondary | ICD-10-CM

## 2018-02-16 NOTE — Telephone Encounter (Signed)
lvm returning call

## 2018-02-16 NOTE — Telephone Encounter (Signed)
I suggested that she start with a neurologist second opinion first.

## 2018-02-16 NOTE — Telephone Encounter (Signed)
Patients granddaughter is concerned about TSH levels. She states that these levels have been low for a year or so but she wasn't being treated because the T4 was normal. She is concerned that some of the symptoms that her grandmother is experiencing may be related to decreased thyroid. She is fatigued and having neuropathy. Recently saw cardiologist and labwork was done and they advised the family to follow up with you about her thyroid. Granddaughter wants to know if you feel comfortable treating the thyroid/ Please advise and route to pool B

## 2018-02-16 NOTE — Telephone Encounter (Signed)
New message:  Pt daughter is calling to give Dr. Rickey Barbara, at [facility]. She is the doctor that she wants her mom to see for Reumatology. The contact # is [phone].  Please advise if you can do a referral.

## 2018-02-16 NOTE — Telephone Encounter (Signed)
-----   Message from Satira Sark, MD sent at 02/16/2018 12:50 PM EDT ----- Results reviewed.  Stable, mild bilateral ICA stenoses of 1 to 39%.  Continue with current follow-up plan. A copy of this test should be forwarded to Claretta Fraise, MD.

## 2018-02-16 NOTE — Telephone Encounter (Signed)
Please advise if you would like to do this referral. I did not see any mention of the referral in your past note from 02/14/18.  Thank you!

## 2018-02-17 ENCOUNTER — Other Ambulatory Visit: Payer: Self-pay | Admitting: *Deleted

## 2018-02-17 ENCOUNTER — Ambulatory Visit: Payer: Medicare Other

## 2018-02-17 DIAGNOSIS — E039 Hypothyroidism, unspecified: Secondary | ICD-10-CM

## 2018-02-17 NOTE — Telephone Encounter (Signed)
I called patient daughter and advised of the note from [place] of the second opinion from neurologist. Daughter states that they seen Stacie Garza and he told them that the neurologist that they currently see Stacie Garza, is their second opinion. That is why she was asking about the referral to the Rheumatologist and if this was something you could do.   Please advise, Thank you!

## 2018-02-17 NOTE — Telephone Encounter (Signed)
OK to put in the referral.

## 2018-02-17 NOTE — Telephone Encounter (Signed)
Pt given carotid US results, copied pcp

## 2018-02-17 NOTE — Telephone Encounter (Signed)
Patient informed. 

## 2018-02-17 NOTE — Progress Notes (Signed)
Order placed

## 2018-02-17 NOTE — Telephone Encounter (Signed)
Order placed.  Scheduling notified.

## 2018-02-17 NOTE — Telephone Encounter (Signed)
ATTEMPT TO REACH, LMTCB-CC

## 2018-02-17 NOTE — Telephone Encounter (Signed)
Granddaughter aware that we need to draw more labs and labs added

## 2018-02-17 NOTE — Telephone Encounter (Signed)
Please add Free T3 to bloodwork.

## 2018-02-18 LAB — SPECIMEN STATUS REPORT

## 2018-02-18 LAB — T3, FREE: T3, Free: 2.1 pg/mL (ref 2.0–4.4)

## 2018-02-20 ENCOUNTER — Other Ambulatory Visit: Payer: Self-pay | Admitting: Family Medicine

## 2018-02-20 MED ORDER — LIOTHYRONINE SODIUM 5 MCG PO TABS: 5.0000 ug | ORAL_TABLET | Freq: Every day | ORAL | 1 refills | Status: DC

## 2018-02-21 ENCOUNTER — Telehealth: Payer: Self-pay | Admitting: Family Medicine

## 2018-02-21 NOTE — Telephone Encounter (Signed)
Pt's daughter is aware referral has been ordered and we are just waiting for appt. Face to face is something I will have to discuss with [facility]. Pt's daughter aware I will call her back once I have more information about the wheelchair.

## 2018-02-22 ENCOUNTER — Other Ambulatory Visit: Payer: Self-pay

## 2018-02-22 DIAGNOSIS — R2689 Other abnormalities of gait and mobility: Secondary | ICD-10-CM

## 2018-02-22 DIAGNOSIS — R200 Anesthesia of skin: Secondary | ICD-10-CM

## 2018-02-23 ENCOUNTER — Other Ambulatory Visit: Payer: Self-pay | Admitting: Family Medicine

## 2018-02-23 NOTE — Telephone Encounter (Signed)
Notes from visit were faxed on 02/21/18 Order is on Dr. Livia Snellen desk for when he returns next week

## 2018-02-28 NOTE — Telephone Encounter (Signed)
Order faxed.

## 2018-03-10 NOTE — Telephone Encounter (Signed)
Called patient and advised that the referral was not working with her current dx. I advised she could discuss further plan at upcoming appointment with Dr.Hochrein. Patient agreed and had no questions or concerns at this time.

## 2018-03-10 NOTE — Telephone Encounter (Signed)
No

## 2018-03-10 NOTE — Telephone Encounter (Signed)
Please advise, received notification from Dr.Criscione-Schreiber referral that they are unable to see for diagnosis that we provided: numbness in feet/imbalance.   Spoke with a rep with their office and she stated they will see for things like: Joint pain, arthritis, lupus things like this.  Is there any other dx that I can associate the referral too for patient to be scheduled with them.   Thank you!

## 2018-03-17 ENCOUNTER — Ambulatory Visit (INDEPENDENT_AMBULATORY_CARE_PROVIDER_SITE_OTHER): Payer: Medicare Other | Admitting: *Deleted

## 2018-03-17 DIAGNOSIS — E538 Deficiency of other specified B group vitamins: Secondary | ICD-10-CM | POA: Diagnosis not present

## 2018-03-17 NOTE — Progress Notes (Signed)
Pt given Cyanocobalamin inj Tolerated well 

## 2018-03-29 ENCOUNTER — Other Ambulatory Visit: Payer: Self-pay | Admitting: Family Medicine

## 2018-04-04 ENCOUNTER — Telehealth: Payer: Self-pay | Admitting: Family Medicine

## 2018-04-04 NOTE — Telephone Encounter (Signed)
Returned granddaughter's [employer]) phone call.  Missy had questions regarding gabapentin and how patient is to take it.  Informed Missy that this office did not prescribe the gabapentin and would need to talk to patient's neurologist.

## 2018-04-04 NOTE — Progress Notes (Signed)
Cardiology Office Note   Date:  04/05/2018   ID:  Stacie Garza, DOB 01-19-1928, MRN [id number]  PCP:  Stacie Fraise, MD  Cardiologist:   No primary care provider on file. Referring:  Stacie Fraise, MD  No chief complaint on file.     History of Present Illness: Stacie Garza is a 84 y.o. female who was seen by Dr. Domenic Polite previously for evaluation of mild carotid artery plaque.  I see mention of a negative Lexiscan [facility] and [facility] in 2016.  She requested follow up given her mild plaque in the past and a reported TIA last year.  At the last visit she wore a monitor.  Unfortunately we are unable to find these results yet.  I did not receive any alerts to suggest atrial fibrillation.  The patient has had no new symptoms.  She has been turned down by [facility] for rheumatology appointment as they did not think it would be anything to add.  She is been seen twice by neurology for a first and second opinion.  Ultimately she is thought to have a neuropathy and is being treated with gabapentin but was only taking a very low dose and did not understand that she been given instructions to slowly increase this dose.  She is starting to do this and has follow-up with neurology.  She is very weak and gets around with walkers.  She has not had any new presyncope or syncope.  She is had no new chest pressure, neck or arm discomfort.  Said no weight gain or edema   Past Medical History:  Diagnosis Date  . Essential hypertension   . Hyperlipidemia   . Hypothyroidism   . Thrombocytosis ([place])   . TIA (transient ischemic attack) 2004    Past Surgical History:  Procedure Laterality Date  . ABDOMINAL HYSTERECTOMY    . HERNIA REPAIR       Current Outpatient Medications  Medication Sig Dispense Refill  . amLODipine (NORVASC) 2.5 MG tablet TAKE 1 TABLET BY MOUTH DAILY 30 tablet 5  . aspirin 325 MG tablet Take 325 mg by mouth 2 (two) times daily.    Marland Kitchen gabapentin (NEURONTIN) 100 MG capsule  Take 100 mg by mouth daily as needed.  4  . liothyronine (CYTOMEL) 5 MCG tablet TAKE 1 TABLET BY MOUTH DAILY 30 tablet 2  . multivitamin-lutein (OCUVITE-LUTEIN) CAPS capsule Take by mouth.    . olmesartan (BENICAR) 40 MG tablet Take 40 mg by mouth daily.  2  . SYNTHROID 150 MCG tablet Take 1 tablet (150 mcg total) by mouth daily before breakfast. 90 tablet 1  . UNABLE TO FIND Med Name: Garlic take one pill daily    . vitamin C (ASCORBIC ACID) 500 MG tablet Take by mouth.    . Vitamin D, Ergocalciferol, (DRISDOL) 50000 units CAPS capsule Take 50,000 Units by mouth once a week.  5  . zinc gluconate 50 MG tablet Take by mouth.    [name] Glycol-Propyl Glycol 0.4-0.3 % SOLN      Current Facility-Administered Medications  Medication Dose Route Frequency Provider Last Rate Last Dose  . cyanocobalamin ((VITAMIN B-12)) injection 1,000 mcg  1,000 mcg Intramuscular Q30 days Stacie Fraise, MD   1,000 mcg at 03/17/18 1106    Allergies:   Flax seed  [linseed oil]; Flaxseed (linseed); Atorvastatin; Chlorthalidone; Conjugated estrogens; Ezetimibe; Lavender oil; Metoprolol; Monascus purpureus went yeast; Simvastatin; Statins; Typhoid vaccines; Bio-flax; and Prenatal vit-fe psac cmplx-fa  ROS:  Please see the history of present illness.   Otherwise, review of systems are positive for none.   All other systems are reviewed and negative.    PHYSICAL EXAM: VS:  BP (!) 169/103   Pulse (!) 117   Ht 5\' 1"  (1.549 m)   Wt 99 lb (44.9 kg)   BMI 18.71 kg/m  , BMI Body mass index is 18.71 kg/m. PHYSICAL EXAM GEN:  No distress, frail NECK:  No jugular venous distention at 90 degrees, waveform within normal limits, carotid upstroke brisk and symmetric, no bruits, no thyromegaly LYMPHATICS:  No cervical adenopathy LUNGS:  Clear to auscultation bilaterally BACK:  No CVA tenderness CHEST:  Unremarkable HEART:  S1 and S2 within normal limits, no S3, no S4, no clicks, no rubs, no murmurs ABD:  Positive  bowel sounds normal in frequency in pitch, no bruits, no rebound, no guarding, unable to assess midline mass or bruit with the patient seated. EXT:  2 plus pulses throughout, moderate edema, no cyanosis no clubbing SKIN:  No rashes no nodules NEURO:  Cranial nerves II through XII grossly intact, motor grossly intact throughout PSYCH:  Cognitively intact, oriented to person place and time   EKG:  EKG is  ordered today. The ekg ordered today demonstrates sinus rhythm, rate 108,  left axis deviation, early transition in V2, no acute ST-T wave changes.   Recent Labs: 02/14/2018: ALT 10; BUN 18; Creatinine, Ser 0.77; Hemoglobin 14.1; Platelets 722; Potassium 4.5; Sodium 143; TSH 0.282    Lipid Panel    Component Value Date/Time   CHOL 212 (H) 02/14/2018 1404   TRIG 116 02/14/2018 1404   HDL 47 02/14/2018 1404   CHOLHDL 4.5 (H) 02/14/2018 1404   LDLCALC 142 (H) 02/14/2018 1404      Wt Readings from Last 3 Encounters:  04/05/18 99 lb (44.9 kg)  02/14/18 105 lb (47.6 kg)  02/14/18 105 lb (47.6 kg)      Other studies Reviewed: Additional studies/ records that were reviewed today include: Extensive review of care everywhere looking in neurology notes previously. Review of the above records demonstrates:     ASSESSMENT AND PLAN:  CAROTID STENOSIS:  This was mild in 2018.   No follow up is indicated.   HTN: The blood pressure is elevated but she thinks this is unusual.  She is going to keep a 2-week blood pressure diary and further changes will be based on this.   TIA:   She is continuing with high-dose aspirin.  She did not tolerate Xarelto.  Suggestions would change to find any evidence of atrial fibrillation on the monitor with the results still outstanding.   WEAKNESS:    She will continue with neurology work-up.  I do not see any indication for further cardiovascular testing.   Current medicines are reviewed at length with the patient today.  The patient does not have concerns  regarding medicines.  The following changes have been made:  None  Labs/ tests ordered today include:  None  No orders of the defined types were placed in this encounter.    Disposition:   FU with me as needed.     Signed, Minus Breeding, MD  04/05/2018 2:35 PM    [facility]

## 2018-04-05 ENCOUNTER — Encounter: Payer: Self-pay | Admitting: Cardiology

## 2018-04-05 ENCOUNTER — Ambulatory Visit (INDEPENDENT_AMBULATORY_CARE_PROVIDER_SITE_OTHER): Payer: Medicare Other | Admitting: Cardiology

## 2018-04-05 VITALS — BP 169/103 | HR 117 | Ht 61.0 in | Wt 99.0 lb

## 2018-04-05 DIAGNOSIS — G459 Transient cerebral ischemic attack, unspecified: Secondary | ICD-10-CM | POA: Diagnosis not present

## 2018-04-05 DIAGNOSIS — I10 Essential (primary) hypertension: Secondary | ICD-10-CM

## 2018-04-05 NOTE — Patient Instructions (Addendum)
Medication Instructions:  The current medical regimen is effective;  continue present plan and medications.  Follow-Up: Follow up as needed with Dr Hochrein.  Thank you for choosing [facility]!!     

## 2018-04-07 ENCOUNTER — Telehealth: Payer: Self-pay | Admitting: *Deleted

## 2018-04-07 NOTE — Telephone Encounter (Signed)
Second call. Patient is only wanting to speak with Florentina Jenny. Please advise

## 2018-04-10 ENCOUNTER — Telehealth: Payer: Self-pay | Admitting: Cardiology

## 2018-04-10 NOTE — Telephone Encounter (Signed)
Returned call to patient's daughter Kermit Balo she stated mother's B/P has been elevated.Readings listed below.Stated she has been taking Olmesartan 40 mg daily instead of Losartan.Losartan worked better and was on back order.Stated she use to take Amlodipine and she stopped taking when she stopped Xarelto.Advised I will send message to Dr.Hochrein for advice.

## 2018-04-10 NOTE — Telephone Encounter (Signed)
Pt c/o BP issue: STAT if pt c/o blurred vision, one-sided weakness or slurred speech  1. What are your last 5 BP readings? 04-06-18  8:00 1.m. 132/98 and pulse was 100- 12;00-153/106 and pulse was 111-8:00 p.m.-163/116 and pulse was 108- 04-07-18- 8:00 am. 165/105 and pulse was 10- 12:00 1`60/107 and pulse was 114 9-14-190 8:00 p.m. 165/104 and pulse was 99- 04-08-18- 8:00 a.m.162/100 and pulse was 106- 12:00  176/101 and pulse was 84- 8:00 p.m.-179/116 and pulse was 96- 07-09-18 8:00 a,m,- 157/99 and pulse was 94- 12:00- 162/109 and pulse was 108- 8:00 p.m.- 182/116 and pulse was 95- 04-10-18 8:00 a.m.- 171/110 and pulse was 102 -12:00 191/119 and pulse was 101AN 2. Are you having any other symptoms (ex. Dizziness, headache, blurred vision, passed out)? Doesn't feel well, little lightheaded  3. What is your BP issue?blood pressure is running extremely high- Dr Percival Spanish told  her to call in  2 weeks and give an update

## 2018-04-11 NOTE — Telephone Encounter (Signed)
S/W Perry,Stacie Garza daughter she states that she never received a CB yesterday about pt's BP has been running high and states that she "needs to know what to do " now. Today her BP is still too high-181/99 HR 100 then 159/97 HR 100 appt scheduled for tomorrow with Angie to discuss BP/HR

## 2018-04-12 ENCOUNTER — Encounter: Payer: Self-pay | Admitting: Physician Assistant

## 2018-04-12 ENCOUNTER — Ambulatory Visit (INDEPENDENT_AMBULATORY_CARE_PROVIDER_SITE_OTHER): Payer: Medicare Other | Admitting: Physician Assistant

## 2018-04-12 VITALS — BP 170/90 | HR 76 | Resp 8 | Ht 61.0 in | Wt 99.0 lb

## 2018-04-12 DIAGNOSIS — D473 Essential (hemorrhagic) thrombocythemia: Secondary | ICD-10-CM | POA: Diagnosis not present

## 2018-04-12 DIAGNOSIS — I10 Essential (primary) hypertension: Secondary | ICD-10-CM

## 2018-04-12 DIAGNOSIS — E785 Hyperlipidemia, unspecified: Secondary | ICD-10-CM

## 2018-04-12 DIAGNOSIS — G459 Transient cerebral ischemic attack, unspecified: Secondary | ICD-10-CM

## 2018-04-12 MED ORDER — AMLODIPINE BESYLATE 5 MG PO TABS: 5.0000 mg | ORAL_TABLET | Freq: Every day | ORAL | 2 refills | Status: DC

## 2018-04-12 NOTE — Progress Notes (Signed)
Cardiology Office Note:    Date:  04/12/2018   ID:  Stacie Garza, DOB [birth year], MRN [id number]  PCP:  Claretta Fraise, MD  Cardiologist:  Minus Breeding, MD   Referring MD: Claretta Fraise, MD   Chief Complaint  Patient presents with  . Blood pressure continues to be elevated    History of Present Illness:    Stacie Garza is a 84 y.o. female with a hx of mild carotid artery plaque, negative [facility] in 2016, TIA in 2018, and labile blood pressure.  She recently wore a heart monitor that did not show any arrhythmias.  She is maintained on high-dose aspirin following her TIA.  She has not tolerated Xarelto in the past.  She has been following with neurology for work-up for weakness.  She last saw Dr. Percival Spanish on 04/05/2018.  It was noted that her blood pressure was elevated at that appointment and she was instructed to keep a two-week blood pressure diary and we will reassess at her follow-up visit.  She presents today with her daughter for follow-up on her blood pressure readings.  Blood pressure log indicates blood pressure has fluctuated between systolic 629-528 at home.  She has felt lightheaded and dizzy.  She has been maintained on 40 mg of olmesartan.  She has previously taken losartan, but this is on back-order. She also states she was taken off of norvasc when she stopped xarelto.  Her daughter states that she did well while on Norvasc.  It is difficult to discern her symptoms as she is having nonspecific neurological symptoms including dizziness and numbness across her entire body.  Blood pressure today was 170/90 with our blood pressure cuff compared to her blood pressure cuff which read 194/109.  I suspect that the cuff size is the problem, she needs a smaller cuff.  The daughter will call the blood pressure monitor company to see if she can obtain a smaller cuff.  She will continue to take her blood pressure and will see me back in clinic in 3 weeks.  Past  Medical History:  Diagnosis Date  . Essential hypertension   . Hyperlipidemia   . Hypothyroidism   . Thrombocytosis ([place])   . TIA (transient ischemic attack) 2004    Past Surgical History:  Procedure Laterality Date  . ABDOMINAL HYSTERECTOMY    . HERNIA REPAIR      Current Medications: Current Meds  Medication Sig  . aspirin 325 MG tablet Take 325 mg by mouth 2 (two) times daily.  Marland Kitchen gabapentin (NEURONTIN) 100 MG capsule Take 100 mg by mouth daily as needed.  Marland Kitchen liothyronine (CYTOMEL) 5 MCG tablet TAKE 1 TABLET BY MOUTH DAILY  . multivitamin-lutein (OCUVITE-LUTEIN) CAPS capsule Take by mouth.  . olmesartan (BENICAR) 40 MG tablet Take 40 mg by mouth daily.  [name] Glycol-Propyl Glycol 0.4-0.3 % SOLN   . SYNTHROID 150 MCG tablet Take 1 tablet (150 mcg total) by mouth daily before breakfast.  . UNABLE TO FIND Med Name: Garlic take one pill daily  . vitamin C (ASCORBIC ACID) 500 MG tablet Take by mouth.  . Vitamin D, Ergocalciferol, (DRISDOL) 50000 units CAPS capsule Take 50,000 Units by mouth once a week.  . zinc gluconate 50 MG tablet Take by mouth.   Current Facility-Administered Medications for the 04/12/18 encounter (Office Visit) with Ledora Bottcher, PA  Medication  . cyanocobalamin ((VITAMIN B-12)) injection 1,000 mcg     Allergies:   Flax seed  [linseed oil];  Flaxseed (linseed); Atorvastatin; Chlorthalidone; Conjugated estrogens; Ezetimibe; Lavender oil; Metoprolol; Monascus purpureus went yeast; Simvastatin; Statins; Typhoid vaccines; Bio-flax; and Prenatal vit-fe psac cmplx-fa   Social History   Socioeconomic History  . Marital status: Divorced    Spouse name: Not on file  . Number of children: Not on file  . Years of education: Not on file  . Highest education level: Not on file  Occupational History  . Occupation: Retired  Scientific laboratory technician  . Financial resource strain: Not on file  . Food insecurity:    Worry: Not on file    Inability: Not on file  .  Transportation needs:    Medical: Not on file    Non-medical: Not on file  Tobacco Use  . Smoking status: Never Smoker  . Smokeless tobacco: Never Used  Substance and Sexual Activity  . Alcohol use: No    Alcohol/week: 0.0 standard drinks  . Drug use: No  . Sexual activity: Not Currently  Lifestyle  . Physical activity:    Days per week: Not on file    Minutes per session: Not on file  . Stress: Not on file  Relationships  . Social connections:    Talks on phone: Not on file    Gets together: Not on file    Attends religious service: Not on file    Active member of club or organization: Not on file    Attends meetings of clubs or organizations: Not on file    Relationship status: Not on file  Other Topics Concern  . Not on file  Social History Narrative  . Not on file     Family History: The patient's family history includes Arthritis in her mother and sister; Cancer in her sister; Diabetes in her sister; Heart attack in her father; Heart disease in her brother and father.  ROS:   Please see the history of present illness.     All other systems reviewed and are negative.  EKGs/Labs/Other Studies Reviewed:    The following studies were reviewed today:  Heart monitor results 04/05/18: No arrhythmias, reviewed by Dr.   EKG:  EKG is not ordered today.   Recent Labs: 02/14/2018: ALT 10; BUN 18; Creatinine, Ser 0.77; Hemoglobin 14.1; Platelets 722; Potassium 4.5; Sodium 143; TSH 0.282  Recent Lipid Panel    Component Value Date/Time   CHOL 212 (H) 02/14/2018 1404   TRIG 116 02/14/2018 1404   HDL 47 02/14/2018 1404   CHOLHDL 4.5 (H) 02/14/2018 1404   LDLCALC 142 (H) 02/14/2018 1404    Physical Exam:    VS:  BP (!) 170/90   Pulse 76   Resp (!) 8   Ht 5\' 1"  (1.549 m)   Wt 99 lb (44.9 kg)   BMI 18.71 kg/m     Wt Readings from Last 3 Encounters:  04/12/18 99 lb (44.9 kg)  04/05/18 99 lb (44.9 kg)  02/14/18 105 lb (47.6 kg)     GEN: elderly slender female  in no acute distress, in wheelchair HEENT: Normal NECK: No JVD; No carotid bruits CARDIAC: RRR, no murmurs, rubs, gallops RESPIRATORY:  Clear to auscultation without rales, wheezing or rhonchi  ABDOMEN: Soft, non-tender, non-distended MUSCULOSKELETAL:  No edema; No deformity  SKIN: Warm and dry NEUROLOGIC:  Alert and oriented x 3 PSYCHIATRIC:  Normal affect   ASSESSMENT:    1. Essential hypertension   2. Hyperlipidemia, unspecified hyperlipidemia type   3. Essential thrombocythemia ([place])   4. TIA (transient ischemic attack)  PLAN:    In order of problems listed above:  Essential hypertension I suspect that she needs a smaller blood pressure cuff given her body habitus her blood pressure monitor at home was reading approximately 20 points different than ours.  She complains of dizziness and numbness over her body.  I will add 5 mg of Norvasc to her regimen.  She will continue taking blood pressure readings for the next 2 to 3 weeks and I will see her back in clinic.  If her blood pressure is still not controlled, I will increase Norvasc.  The daughter will call the blood pressure monitor company to see if she can get a smaller cuff.  Hyperlipidemia, unspecified hyperlipidemia type 02/14/2018: Cholesterol, Total 212; HDL 47; LDL Calculated 142; Triglycerides 116   Essential thrombocythemia ([place]) Per primary.  TIA (transient ischemic attack) Continue aspirin as prescribed by neurology.  Would like to keep her blood pressure below 160.  Patient would benefit from a wheelchair ramp at her home given her multiple neurological complaints.  Follow-up with me in clinic in 3 weeks.   Medication Adjustments/Labs and Tests Ordered: Current medicines are reviewed at length with the patient today.  Concerns regarding medicines are outlined above.  No orders of the defined types were placed in this encounter.  Meds ordered this encounter  Medications  . amLODipine (NORVASC) 5 MG  tablet    Sig: Take 1 tablet (5 mg total) by mouth daily.    Dispense:  30 tablet    Refill:  2    Signed, Ledora Bottcher, PA  04/12/2018 11:40 AM    [place]

## 2018-04-12 NOTE — Patient Instructions (Addendum)
Medication Instructions:  Start Norvasc 5mg  daily.  If you need a refill on your cardiac medications before your next appointment, please call your pharmacy.  Labwork: None Ordered.  Testing/Procedures: None Ordered.  Follow-Up: Your physician wants you to keep appointment with Doreene Adas, PA on 05/02/18  Keep BP log Discuss ordering a child size blood pressure cuff for your machine at home.  Thank you for choosing [facility]!!

## 2018-04-14 ENCOUNTER — Ambulatory Visit (INDEPENDENT_AMBULATORY_CARE_PROVIDER_SITE_OTHER): Payer: Medicare Other | Admitting: *Deleted

## 2018-04-14 DIAGNOSIS — E538 Deficiency of other specified B group vitamins: Secondary | ICD-10-CM | POA: Diagnosis not present

## 2018-04-14 NOTE — Progress Notes (Signed)
Pt given cyanocobalamin inj Tolerated well 

## 2018-05-01 NOTE — Progress Notes (Addendum)
Cardiology Office Note:    Date:  05/02/2018   ID:  Stacie Garza, DOB [birth year], MRN [id number]  PCP:  Claretta Fraise, MD  Cardiologist:  Minus Breeding, MD   Referring MD: Claretta Fraise, MD   Chief Complaint  Patient presents with  . Follow-up  Hypertension  History of Present Illness:    Stacie Garza is a 84 y.o. female with a hx of mild carotid artery plaque, negative [facility] in 2016, TIA in 2018, and labile blood pressure.  She recently wore a heart monitor that did not show any arrhythmias.  She is maintained on high-dose aspirin following her TIA.  She has not tolerated Xarelto in the past.  She has been following with neurology for work-up for weakness.  She last saw Dr. Percival Spanish on 04/05/2018.  It was noted that her blood pressure was elevated at that appointment and she was instructed to keep a two-week blood pressure diary and we will reassess at her follow-up visit. I saw her in follow up 04/12/18. At that time, her BP log showed labile pressures from 606-301 systolic. Home monitor was reading 20 points higher than our monitor - likely due to cuff being too big. I added 5 mg norvasc to her ARB.   She returns for BP follow up. Her log indicates her systolic pressure is running 140-160s. She is feeling better from a pressure standpoint. She continues to have neurological complaints, gabapentin is helping. She is following with a neurologist. I am pleased with her pressure and she does not wish to make medication changes. I am pleased with her progress.     Past Medical History:  Diagnosis Date  . Essential hypertension   . Hyperlipidemia   . Hypothyroidism   . Thrombocytosis ([place])   . TIA (transient ischemic attack) 2004    Past Surgical History:  Procedure Laterality Date  . ABDOMINAL HYSTERECTOMY    . HERNIA REPAIR      Current Medications: Current Meds  Medication Sig  . amLODipine (NORVASC) 5 MG tablet Take 1 tablet (5 mg total) by mouth  daily.  Marland Kitchen aspirin 325 MG tablet Take 325 mg by mouth 2 (two) times daily.  Marland Kitchen gabapentin (NEURONTIN) 100 MG capsule Take 100 mg by mouth daily as needed.  . Ibuprofen (ADVIL) 200 MG CAPS Take 2 capsules by mouth at bedtime.  Marland Kitchen liothyronine (CYTOMEL) 5 MCG tablet TAKE 1 TABLET BY MOUTH DAILY  . multivitamin-lutein (OCUVITE-LUTEIN) CAPS capsule Take by mouth.  . olmesartan (BENICAR) 40 MG tablet Take 40 mg by mouth daily.  [name] Glycol-Propyl Glycol 0.4-0.3 % SOLN   . Sennosides-Docusate Sodium (SENNA S PO) Take by mouth at bedtime.  Marland Kitchen SYNTHROID 150 MCG tablet Take 1 tablet (150 mcg total) by mouth daily before breakfast.  . UNABLE TO FIND Med Name: Garlic take one pill daily  . vitamin C (ASCORBIC ACID) 500 MG tablet Take by mouth.  . Vitamin D, Ergocalciferol, (DRISDOL) 50000 units CAPS capsule Take 50,000 Units by mouth once a week.  . zinc gluconate 50 MG tablet Take by mouth.   Current Facility-Administered Medications for the 05/02/18 encounter (Office Visit) with Ledora Bottcher, PA  Medication  . cyanocobalamin ((VITAMIN B-12)) injection 1,000 mcg     Allergies:   Flax seed  [linseed oil]; Flaxseed (linseed); Atorvastatin; Chlorthalidone; Conjugated estrogens; Ezetimibe; Lavender oil; Metoprolol; Monascus purpureus went yeast; Simvastatin; Statins; Typhoid vaccines; Bio-flax; and Prenatal vit-fe psac cmplx-fa   Social History  Socioeconomic History  . Marital status: Divorced    Spouse name: Not on file  . Number of children: Not on file  . Years of education: Not on file  . Highest education level: Not on file  Occupational History  . Occupation: Retired  Scientific laboratory technician  . Financial resource strain: Not on file  . Food insecurity:    Worry: Not on file    Inability: Not on file  . Transportation needs:    Medical: Not on file    Non-medical: Not on file  Tobacco Use  . Smoking status: Never Smoker  . Smokeless tobacco: Never Used  Substance and Sexual Activity   . Alcohol use: No    Alcohol/week: 0.0 standard drinks  . Drug use: No  . Sexual activity: Not Currently  Lifestyle  . Physical activity:    Days per week: Not on file    Minutes per session: Not on file  . Stress: Not on file  Relationships  . Social connections:    Talks on phone: Not on file    Gets together: Not on file    Attends religious service: Not on file    Active member of club or organization: Not on file    Attends meetings of clubs or organizations: Not on file    Relationship status: Not on file  Other Topics Concern  . Not on file  Social History Narrative  . Not on file     Family History: The patient's family history includes Arthritis in her mother and sister; Cancer in her sister; Diabetes in her sister; Heart attack in her father; Heart disease in her brother and father.  ROS:   Please see the history of present illness.     All other systems reviewed and are negative.  EKGs/Labs/Other Studies Reviewed:    The following studies were reviewed today:  Cardiac event monitor 9/11819: No arrhythmia   EKG:  EKG is not ordered today.   Recent Labs: 02/14/2018: ALT 10; BUN 18; Creatinine, Ser 0.77; Hemoglobin 14.1; Platelets 722; Potassium 4.5; Sodium 143; TSH 0.282  Recent Lipid Panel    Component Value Date/Time   CHOL 212 (H) 02/14/2018 1404   TRIG 116 02/14/2018 1404   HDL 47 02/14/2018 1404   CHOLHDL 4.5 (H) 02/14/2018 1404   LDLCALC 142 (H) 02/14/2018 1404    Physical Exam:    VS:  BP (!) 146/82 (BP Location: Left Arm, Patient Position: Sitting, Cuff Size: Normal)   Pulse (!) 105   Ht 5\' 1"  (1.549 m)   Wt 98 lb 12.8 oz (44.8 kg)   BMI 18.67 kg/m     Wt Readings from Last 3 Encounters:  05/02/18 98 lb 12.8 oz (44.8 kg)  04/12/18 99 lb (44.9 kg)  04/05/18 99 lb (44.9 kg)     GEN: elderly thin female in no acute distress HEENT: Normal NECK: No JVD; No carotid bruits CARDIAC: RRR, + murmurs, rubs, gallops RESPIRATORY:  Clear to  auscultation without rales, wheezing or rhonchi  ABDOMEN: Soft, non-tender, non-distended MUSCULOSKELETAL:  No edema; No deformity  SKIN: Warm and dry NEUROLOGIC:  Alert and oriented x 3 PSYCHIATRIC:  Normal affect   ASSESSMENT:    1. Essential hypertension   2. TIA (transient ischemic attack)   3. Neuropathy   4. Hyperlipidemia, unspecified hyperlipidemia type    PLAN:    In order of problems listed above:  Essential hypertension Pressures are much better controlled. Systolic pressure now in the 140-160s. She  is asymptomatic and does not wish to make any medication changes.   TIA (transient ischemic attack) No recent events.  Neuropathy Following with neurology, doing well on gabapentin. Questions lyrica.   Hyperlipidemia, unspecified hyperlipidemia type 02/14/2018: Cholesterol, Total 212; HDL 47; LDL Calculated 142; Triglycerides 116 No changes. Intolerant to statins and zetia. Control with diet.   Follow up in 6 months.     Medication Adjustments/Labs and Tests Ordered: Current medicines are reviewed at length with the patient today.  Concerns regarding medicines are outlined above.  No orders of the defined types were placed in this encounter.  No orders of the defined types were placed in this encounter.   Signed, Tami Lin Duke, PA  05/02/2018 2:23 PM    [facility]

## 2018-05-02 ENCOUNTER — Encounter: Payer: Self-pay | Admitting: Physician Assistant

## 2018-05-02 ENCOUNTER — Ambulatory Visit (INDEPENDENT_AMBULATORY_CARE_PROVIDER_SITE_OTHER): Payer: Medicare Other | Admitting: Physician Assistant

## 2018-05-02 VITALS — BP 146/82 | HR 105 | Ht 61.0 in | Wt 98.8 lb

## 2018-05-02 DIAGNOSIS — I10 Essential (primary) hypertension: Secondary | ICD-10-CM

## 2018-05-02 DIAGNOSIS — E785 Hyperlipidemia, unspecified: Secondary | ICD-10-CM | POA: Diagnosis not present

## 2018-05-02 DIAGNOSIS — G459 Transient cerebral ischemic attack, unspecified: Secondary | ICD-10-CM

## 2018-05-02 DIAGNOSIS — G629 Polyneuropathy, unspecified: Secondary | ICD-10-CM

## 2018-05-02 NOTE — Patient Instructions (Signed)
Medication Instructions:  Your physician recommends that you continue on your current medications as directed. Please refer to the Current Medication list given to you today.  If you need a refill on your cardiac medications before your next appointment, please call your pharmacy.   Lab work: None  If you have labs (blood work) drawn today and your tests are completely normal, you will receive your results only by: Marland Kitchen MyChart Message (if you have MyChart) OR . A paper copy in the mail If you have any lab test that is abnormal or we need to change your treatment, we will call you to review the results.  Testing/Procedures: None   Follow-Up: At [facility], you and your health needs are our priority.  As part of our continuing mission to provide you with exceptional heart care, we have created designated Provider Care Teams.  These Care Teams include your primary Cardiologist (physician) and Advanced Practice Providers (APPs -  Physician Assistants and Nurse Practitioners) who all work together to provide you with the care you need, when you need it. You will need a follow up appointment in 6 months.  Please call our office 2 months in advance to schedule this appointment.  You may see Minus Breeding, MD or one of the following Advanced Practice Providers on your designated Care Team:   Rosaria Ferries, PA-C . Jory Sims, DNP, ANP  Any Other Special Instructions Will Be Listed Below (If Applicable).

## 2018-05-11 DIAGNOSIS — G603 Idiopathic progressive neuropathy: Secondary | ICD-10-CM | POA: Diagnosis not present

## 2018-05-11 DIAGNOSIS — R2681 Unsteadiness on feet: Secondary | ICD-10-CM | POA: Diagnosis not present

## 2018-05-11 DIAGNOSIS — I739 Peripheral vascular disease, unspecified: Secondary | ICD-10-CM | POA: Diagnosis not present

## 2018-05-11 DIAGNOSIS — E538 Deficiency of other specified B group vitamins: Secondary | ICD-10-CM | POA: Diagnosis not present

## 2018-05-16 DIAGNOSIS — L820 Inflamed seborrheic keratosis: Secondary | ICD-10-CM | POA: Diagnosis not present

## 2018-05-16 DIAGNOSIS — L723 Sebaceous cyst: Secondary | ICD-10-CM | POA: Diagnosis not present

## 2018-05-16 DIAGNOSIS — Z23 Encounter for immunization: Secondary | ICD-10-CM | POA: Diagnosis not present

## 2018-05-17 ENCOUNTER — Ambulatory Visit (INDEPENDENT_AMBULATORY_CARE_PROVIDER_SITE_OTHER): Payer: Medicare Other | Admitting: Family Medicine

## 2018-05-17 ENCOUNTER — Encounter: Payer: Self-pay | Admitting: Family Medicine

## 2018-05-17 VITALS — BP 143/91 | HR 111 | Temp 97.5°F

## 2018-05-17 DIAGNOSIS — E538 Deficiency of other specified B group vitamins: Secondary | ICD-10-CM

## 2018-05-17 DIAGNOSIS — G629 Polyneuropathy, unspecified: Secondary | ICD-10-CM | POA: Diagnosis not present

## 2018-05-17 MED ORDER — PREGABALIN 150 MG PO CAPS: 150.0000 mg | ORAL_CAPSULE | Freq: Every day | ORAL | 5 refills | Status: AC

## 2018-05-17 MED ORDER — MEGESTROL ACETATE 40 MG/ML PO SUSP: 400.0000 mg | Freq: Two times a day (BID) | ORAL | 5 refills | Status: AC

## 2018-05-22 NOTE — Progress Notes (Signed)
Subjective:  Patient ID: Stacie Garza, female    DOB: 25-Nov-1927  Age: 84 y.o. MRN: [id number]  CC: Medical Management of Chronic Issues (numbness from feet to knees bilaterally, would like to discuss using Lyrica, discontinued Gabapentin because it caused memory problems.)   HPI Stacie Garza presents for memory loss on gabapentin. DCed it about a week ago. MEmory better, but pain and numbness in legs has returned. Pt. Wonders why her kidney function is not being monitored. Recent labs reviewed with her as a result showing that it has been monitored regularly. Pt. Daughter is with her. She has read up on lyrica and feels that I should prescribe it because she has determined it is her mother's best option for control of her pain and numbness in her legs. She is also unstable walking. In a wheelchair today. All due to neuropathy for which she sees neurology.    Depression screen [facility] 2/9 05/17/2018 02/14/2018 07/15/2017  Decreased Interest 0 0 0  Down, Depressed, Hopeless 0 0 0  PHQ - 2 Score 0 0 0    History Stacie Garza has a past medical history of Essential hypertension, Hyperlipidemia, Hypothyroidism, Thrombocytosis ([place]), and TIA (transient ischemic attack) (2004).   She has a past surgical history that includes Hernia repair and Abdominal hysterectomy.   Her family history includes Arthritis in her mother and sister; Cancer in her sister; Diabetes in her sister; Heart attack in her father; Heart disease in her brother and father.She reports that she has never smoked. She has never used smokeless tobacco. She reports that she does not drink alcohol or use drugs.    ROS Review of Systems  Constitutional: Positive for appetite change (diminished, losing weight).  HENT: Negative.   Eyes: Negative for visual disturbance.  Respiratory: Negative for shortness of breath.   Cardiovascular: Negative for chest pain.  Gastrointestinal: Negative for abdominal pain.  Musculoskeletal: Negative for  arthralgias.  Neurological: Positive for weakness and numbness (with tingling and burning in legs).    Objective:  BP (!) 143/91   Pulse (!) 111   Temp (!) 97.5 F (36.4 C) (Oral)   BP Readings from Last 3 Encounters:  05/17/18 (!) 143/91  05/02/18 (!) 146/82  04/12/18 (!) 170/90    Wt Readings from Last 3 Encounters:  05/02/18 98 lb 12.8 oz (44.8 kg)  04/12/18 99 lb (44.9 kg)  04/05/18 99 lb (44.9 kg)     Physical Exam  Constitutional: She is oriented to person, place, and time. She appears well-developed and well-nourished. No distress.  Cardiovascular: Normal rate and regular rhythm.  Pulmonary/Chest: Breath sounds normal.  Neurological: She is alert and oriented to person, place, and time.  Skin: Skin is warm and dry.  Psychiatric: Her mood appears anxious. She is aggressive. Cognition and memory are normal. She expresses impulsivity.      Assessment & Plan:   Stacie Garza was seen today for medical management of chronic issues.  Diagnoses and all orders for this visit:  Neuropathy  Other orders -     pregabalin (LYRICA) 150 MG capsule; Take 1 capsule (150 mg total) by mouth at bedtime. -     megestrol (MEGACE ORAL) 40 MG/ML suspension; Take 10 mLs (400 mg total) by mouth 2 (two) times daily.       I have discontinued Reita Chard. Storie's gabapentin. I am also having her start on pregabalin and megestrol. Additionally, I am having her maintain her zinc gluconate, multivitamin-lutein, vitamin C, Polyethyl Glycol-Propyl Glycol,  UNABLE TO FIND, Vitamin D (Ergocalciferol), aspirin, olmesartan, SYNTHROID, liothyronine, amLODipine, Sennosides-Docusate Sodium (SENNA S PO), and Ibuprofen. We administered cyanocobalamin. We will continue to administer cyanocobalamin.  Allergies as of 05/17/2018      Reactions   Flax Seed  [linseed Oil] Itching, Rash   Blood in stool   Flaxseed (linseed) Itching, Other (See Comments), Rash   Blood in stool   Atorvastatin Other (See Comments)     Back pain   Chlorthalidone Other (See Comments)   Dry eye (Eyelid stuck to eyeball)   Conjugated Estrogens Other (See Comments)   Can't remember reaction   Ezetimibe Other (See Comments)   Dizziness, Fatigue, numbness in hands and arms, irregular heart beat, tightness in chest   Lavender Oil Other (See Comments)   Dizziness, Head felt dizzy and foggy, left arm pain, fatigue, off balance   Metoprolol Other (See Comments)   Headache,  Insomnia, Unstable gait, Lethargy, intermittent SOB.  (Last dose 07/24/2014)   Monascus Purpureus Went Yeast Nausea Only, Other (See Comments)   Off balance, head felt heavy, aching in shoulders, fatigue, tightness in chest   Simvastatin    Pain in back of head and neck   Statins Other (See Comments)   Bladder infections Pain in back of head and neck, UTI, back pain   Typhoid Vaccines Other (See Comments)   redness   Bio-flax    Prenatal Vit-fe Psac Cmplx-fa    Constipation with all Iron      Medication List        Accurate as of 05/17/18 11:59 PM. Always use your most recent med list.          ADVIL 200 MG Caps Generic drug:  Ibuprofen Take 2 capsules by mouth at bedtime.   amLODipine 5 MG tablet Commonly known as:  NORVASC Take 1 tablet (5 mg total) by mouth daily.   aspirin 325 MG tablet Take 325 mg by mouth 2 (two) times daily.   liothyronine 5 MCG tablet Commonly known as:  CYTOMEL TAKE 1 TABLET BY MOUTH DAILY   megestrol 40 MG/ML suspension Commonly known as:  MEGACE Take 10 mLs (400 mg total) by mouth 2 (two) times daily.   multivitamin-lutein Caps capsule Take by mouth.   olmesartan 40 MG tablet Commonly known as:  BENICAR Take 40 mg by mouth daily.   Polyethyl Glycol-Propyl Glycol 0.4-0.3 % Soln   pregabalin 150 MG capsule Commonly known as:  LYRICA Take 1 capsule (150 mg total) by mouth at bedtime.   SENNA S PO Take by mouth at bedtime.   SYNTHROID 150 MCG tablet Generic drug:  levothyroxine Take 1 tablet  (150 mcg total) by mouth daily before breakfast.   UNABLE TO FIND Med Name: Garlic take one pill daily   vitamin C 500 MG tablet Commonly known as:  ASCORBIC ACID Take by mouth.   Vitamin D (Ergocalciferol) 50000 units Caps capsule Commonly known as:  DRISDOL Take 50,000 Units by mouth once a week.   zinc gluconate 50 MG tablet Take by mouth.        Follow-up: Return if symptoms worsen or fail to improve.  Claretta Fraise, M.D.

## 2018-06-09 ENCOUNTER — Ambulatory Visit (INDEPENDENT_AMBULATORY_CARE_PROVIDER_SITE_OTHER): Payer: Medicare Other | Admitting: *Deleted

## 2018-06-09 DIAGNOSIS — Z23 Encounter for immunization: Secondary | ICD-10-CM | POA: Diagnosis not present

## 2018-06-16 ENCOUNTER — Ambulatory Visit (INDEPENDENT_AMBULATORY_CARE_PROVIDER_SITE_OTHER): Payer: Medicare Other | Admitting: *Deleted

## 2018-06-16 DIAGNOSIS — G629 Polyneuropathy, unspecified: Secondary | ICD-10-CM | POA: Diagnosis not present

## 2018-06-16 DIAGNOSIS — E538 Deficiency of other specified B group vitamins: Secondary | ICD-10-CM

## 2018-06-16 NOTE — Progress Notes (Signed)
Pt given cyanocobalamin inj Tolerated well 

## 2018-06-19 IMAGING — MR MR HEAD WO/W CM
7 of 13 series · 28 of 48 positions shown · IV contrast (multihance)
Comparison: MRI brain 12/06/2015

CLINICAL DATA: Lightheaded. Vertigo of central origin, unspecified
laterality. Next healed

EXAM:
MRI HEAD WITHOUT AND WITH CONTRAST
TECHNIQUE: Multiplanar, multiecho pulse sequences of the brain and surrounding
structures were obtained without and with intravenous contrast.
CONTRAST:  10mL MULTIHANCE GADOBENATE DIMEGLUMINE 529 MG/ML IV SOLN

[Series 3: DWI · axial · 3.0mm · 0.68mm/px · z∈[-30,+129]mm · 5 of 54 slices shown (1 of 4)]
[im 1/54]
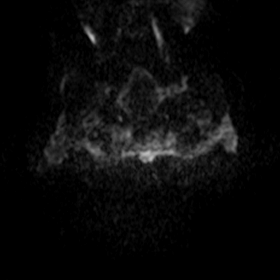
[im 14/54]
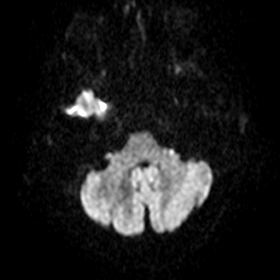
[im 27/54]
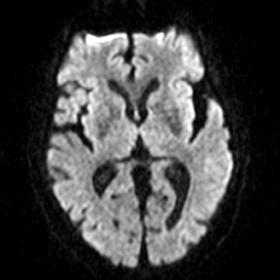
[im 40/54]
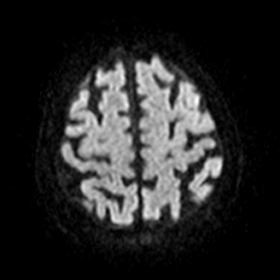
[im 54/54]
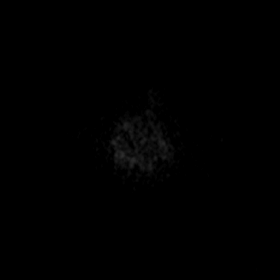

[Series 4: DWI · axial · 3.0mm · 0.76mm/px · z∈[-33,+128]mm · 5 of 55 slices shown (2 of 4)]
[im 1/55]
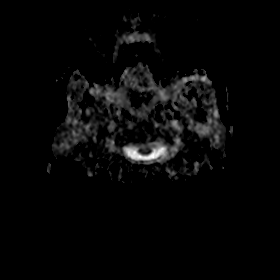
[im 14/55]
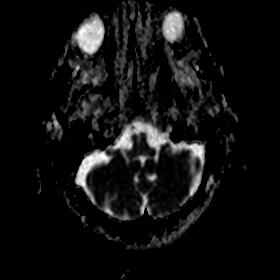
[im 28/55]
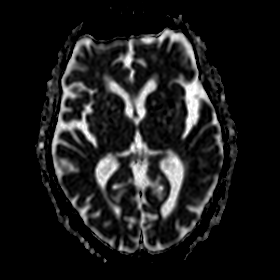
[im 41/55]
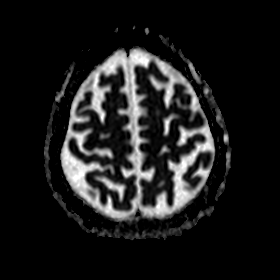
[im 55/55]
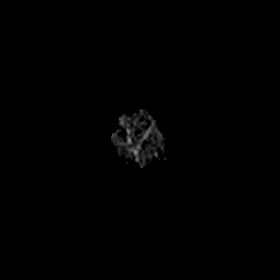

[Series 5: DWI · coronal · 5.0mm · 0.48mm/px · 3 of 34 slices shown (3 of 4)]
[im 1/34]
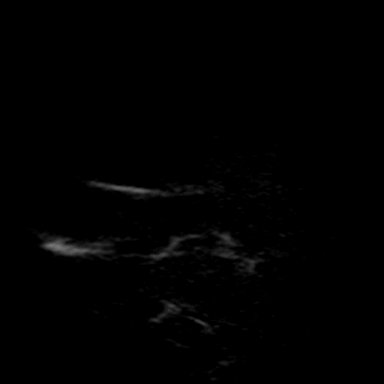
[im 17/34]
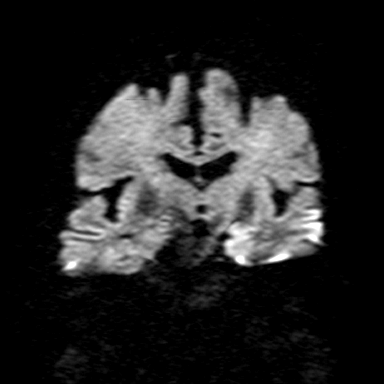
[im 34/34]
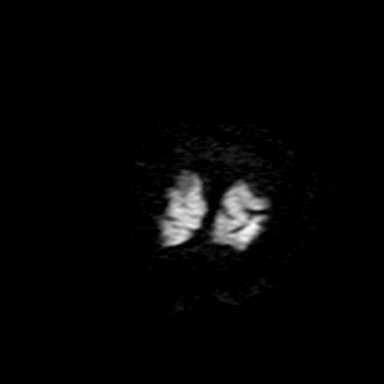

[Series 6: DWI · coronal · 5.0mm · 0.52mm/px · 3 of 34 slices shown (4 of 4)]
[im 1/34]
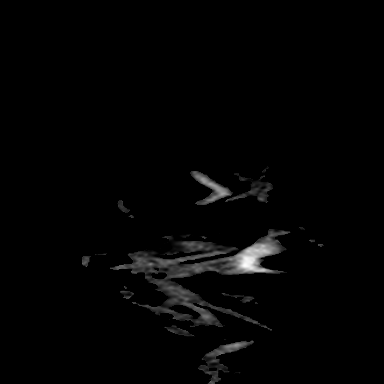
[im 17/34]
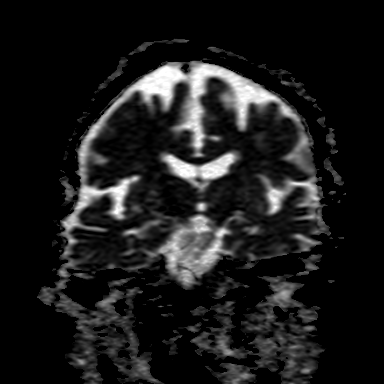
[im 34/34]
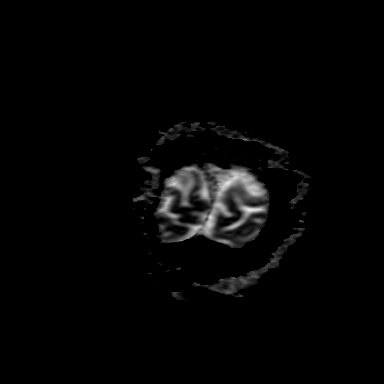

[Series 8: FLAIR · axial · 3.0mm · 0.32mm/px · z∈[-26,+111]mm · 4 of 47 slices shown]
[im 1/47]
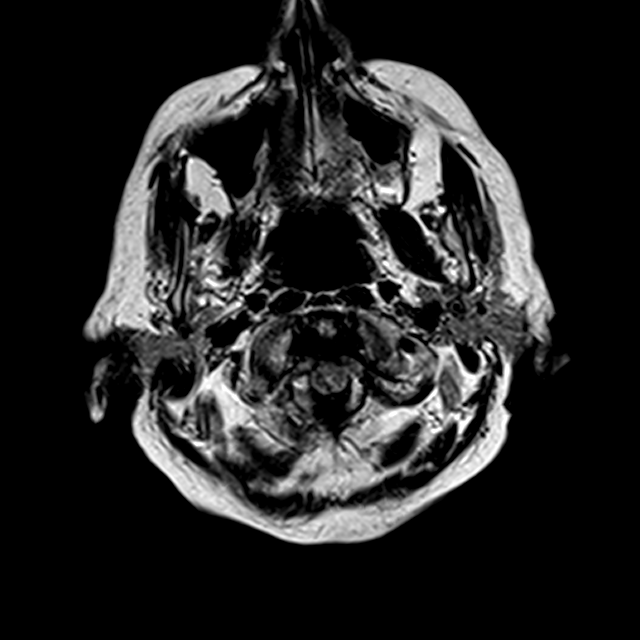
[im 16/47]
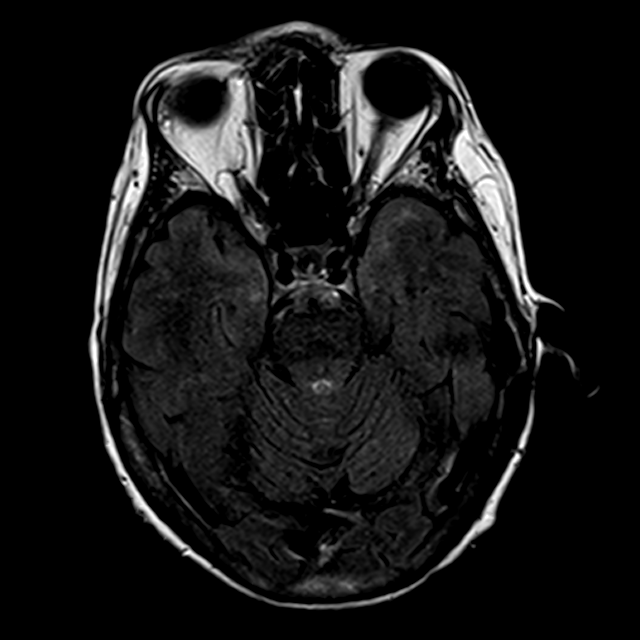
[im 31/47]
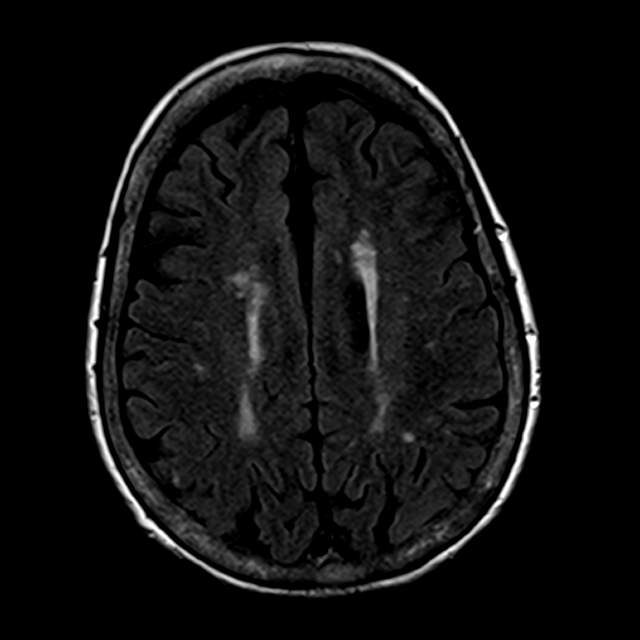
[im 47/47]
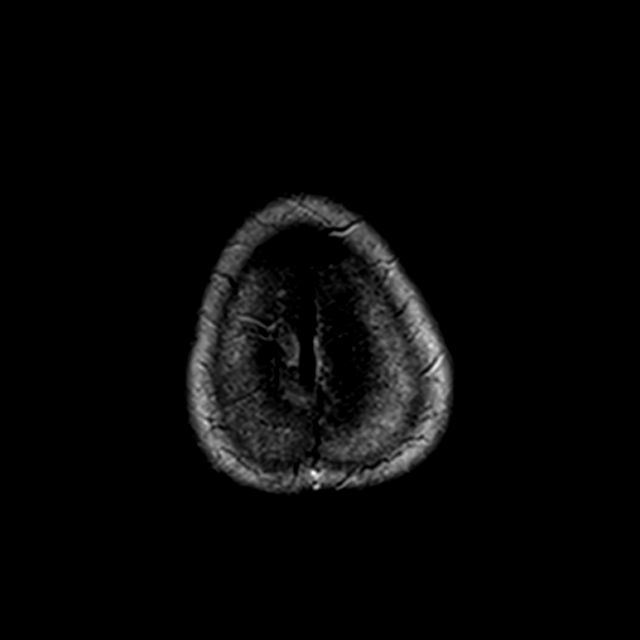

[Series 13: T1 post-contrast · axial · 2.0mm · 0.41mm/px · z∈[-28,+145]mm · 7 of 83 slices shown (1 of 2)]
[im 1/83]
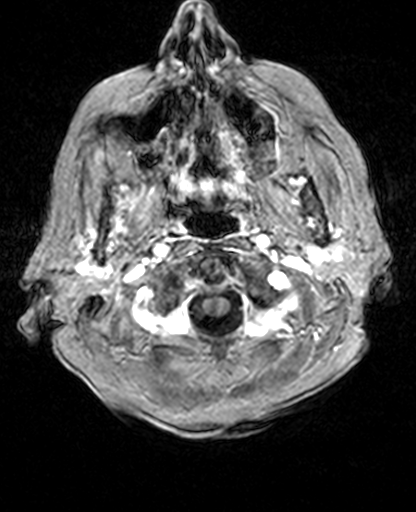
[im 14/83]
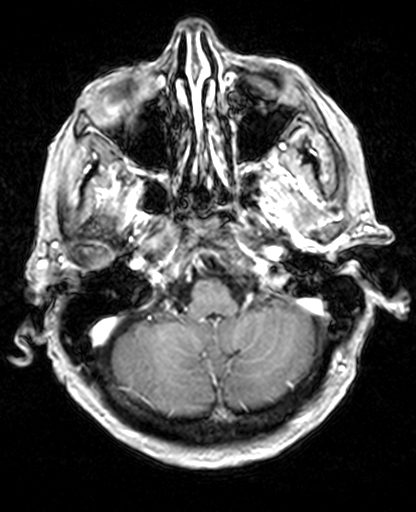
[im 28/83]
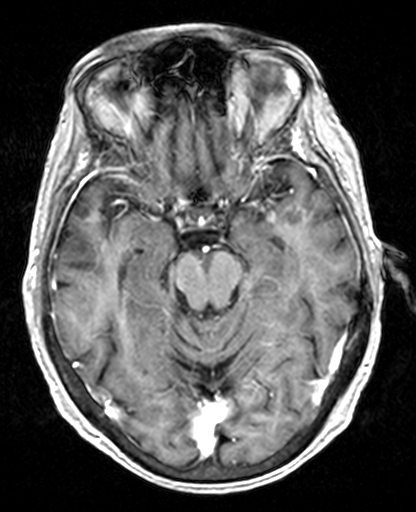
[im 42/83]
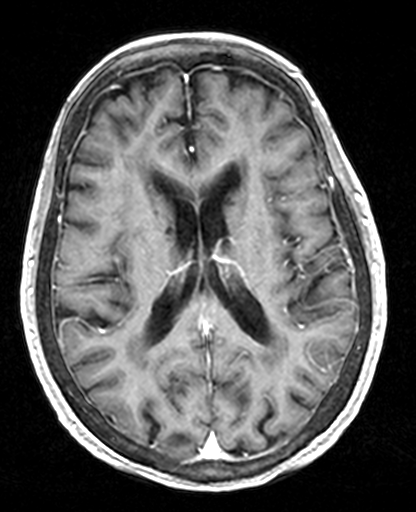
[im 55/83]
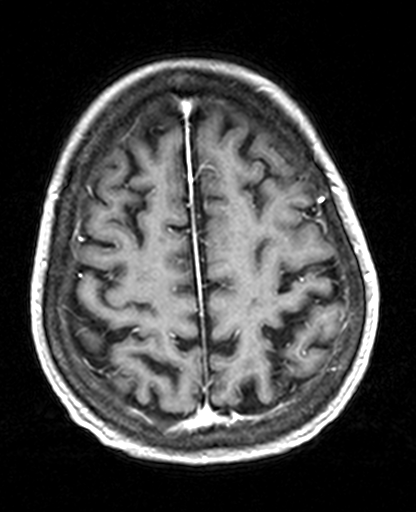
[im 69/83]
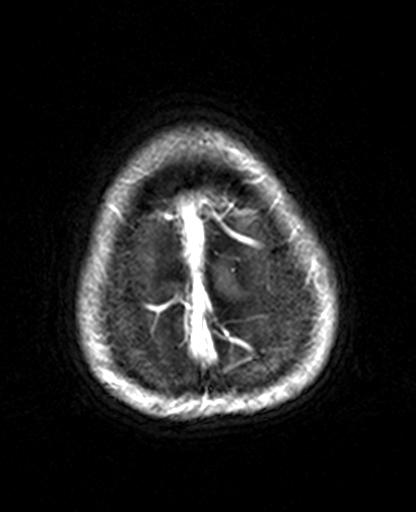
[im 83/83]
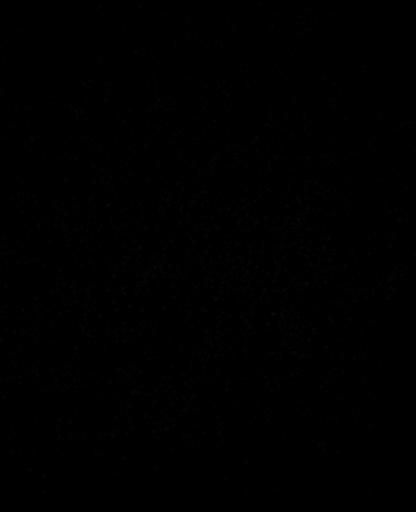

[Series 14: T1 post-contrast · coronal · 5.0mm · 0.37mm/px · 1 of 28 slices shown (2 of 2)]
[im 1/28]
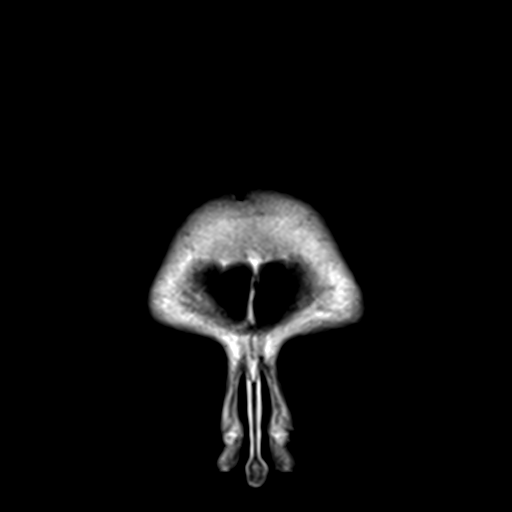

[28 of 48 positions shown; findings below may reference images not displayed]

FINDINGS: Brain: A punctate acute/subacute nonhemorrhagic lacunar infarct is
present in the right cerebellum. No other acute abnormalities are
present. Moderate diffuse periventricular and subcortical white
matter disease is otherwise stable. Remote lacunar infarcts
involving the caudate head are stable bilaterally. White matter
changes in the brainstem are similar the prior exam. Remote lacunar
infarcts of the cerebellum are again noted.

The ventricles are of normal size. No significant extra-axial fluid
collection is present.

Vascular: Flow is present in the major intracranial arteries.
Bilateral lens replacements are present. The globes and orbits are
within normal limits.

Skull and upper cervical spine: The skullbase is normal.
Craniocervical junction is normal. Sella is unremarkable. Midline
sagittal structures are within normal limits.

Sinuses/Orbits: The paranasal sinuses and mastoid air cells are
clear. Bilateral lens replacements are present. The globes and
orbits are within normal limits.
IMPRESSION: 1. Acute/subacute punctate nonhemorrhagic lacunar infarct in the
right cerebellum could account for the patient's acute symptoms.
2. Otherwise stable moderate diffuse periventricular and subcortical
white matter disease bilaterally.
3. Stable remote lacunar infarcts involving the basal ganglia and
cerebellum.

## 2018-06-22 ENCOUNTER — Other Ambulatory Visit: Payer: Self-pay | Admitting: Family Medicine

## 2018-06-23 ENCOUNTER — Other Ambulatory Visit: Payer: Self-pay | Admitting: Family Medicine

## 2018-07-14 ENCOUNTER — Ambulatory Visit (INDEPENDENT_AMBULATORY_CARE_PROVIDER_SITE_OTHER): Payer: Medicare Other | Admitting: *Deleted

## 2018-07-14 DIAGNOSIS — E538 Deficiency of other specified B group vitamins: Secondary | ICD-10-CM | POA: Diagnosis not present

## 2018-07-14 DIAGNOSIS — G629 Polyneuropathy, unspecified: Secondary | ICD-10-CM | POA: Diagnosis not present

## 2018-07-14 NOTE — Progress Notes (Signed)
Pt given Cyanocobalamin inj Tolerated well 

## 2018-07-31 ENCOUNTER — Telehealth: Payer: Self-pay | Admitting: Family Medicine

## 2018-07-31 ENCOUNTER — Encounter: Payer: Self-pay | Admitting: Family Medicine

## 2018-07-31 ENCOUNTER — Ambulatory Visit (INDEPENDENT_AMBULATORY_CARE_PROVIDER_SITE_OTHER): Payer: Medicare Other | Admitting: Family Medicine

## 2018-07-31 ENCOUNTER — Ambulatory Visit (INDEPENDENT_AMBULATORY_CARE_PROVIDER_SITE_OTHER): Payer: Medicare Other

## 2018-07-31 VITALS — BP 132/83 | HR 74 | Temp 98.0°F

## 2018-07-31 DIAGNOSIS — R059 Cough, unspecified: Secondary | ICD-10-CM

## 2018-07-31 DIAGNOSIS — J181 Lobar pneumonia, unspecified organism: Secondary | ICD-10-CM | POA: Diagnosis not present

## 2018-07-31 DIAGNOSIS — R05 Cough: Secondary | ICD-10-CM | POA: Diagnosis not present

## 2018-07-31 DIAGNOSIS — K5901 Slow transit constipation: Secondary | ICD-10-CM

## 2018-07-31 DIAGNOSIS — J189 Pneumonia, unspecified organism: Secondary | ICD-10-CM

## 2018-07-31 MED ORDER — AMOXICILLIN-POT CLAVULANATE 875-125 MG PO TABS: 1.0000 | ORAL_TABLET | Freq: Two times a day (BID) | ORAL | 0 refills | Status: AC

## 2018-07-31 MED ORDER — AZITHROMYCIN 250 MG PO TABS: ORAL_TABLET | ORAL | 0 refills | Status: AC

## 2018-07-31 NOTE — Telephone Encounter (Signed)
Yes, she can.

## 2018-07-31 NOTE — Progress Notes (Addendum)
Subjective:    Patient ID: Stacie Garza, female    DOB: 1928/07/01, 85 y.o.   MRN: [id number]  Chief Complaint:  Chest congestion and cough   HPI: Stacie Garza is a 85 y.o. female presenting on 07/31/2018 for Chest congestion and cough  Pt presents today with complaints of cough, weakness, fatigue, intermittent shortness of breath, raspy voice, and chills. She has had some sputum production, states yellow in color. States she has had a decreased appetite and general weakness.   Relevant past medical, surgical, family, and social history reviewed and updated as indicated.  Allergies and medications reviewed and updated.   Past Medical History:  Diagnosis Date  . Essential hypertension   . Hyperlipidemia   . Hypothyroidism   . Thrombocytosis ([place])   . TIA (transient ischemic attack) 2004    Past Surgical History:  Procedure Laterality Date  . ABDOMINAL HYSTERECTOMY    . HERNIA REPAIR      Social History   Socioeconomic History  . Marital status: Divorced    Spouse name: Not on file  . Number of children: Not on file  . Years of education: Not on file  . Highest education level: Not on file  Occupational History  . Occupation: Retired  Scientific laboratory technician  . Financial resource strain: Not on file  . Food insecurity:    Worry: Not on file    Inability: Not on file  . Transportation needs:    Medical: Not on file    Non-medical: Not on file  Tobacco Use  . Smoking status: Never Smoker  . Smokeless tobacco: Never Used  Substance and Sexual Activity  . Alcohol use: No    Alcohol/week: 0.0 standard drinks  . Drug use: No  . Sexual activity: Not Currently  Lifestyle  . Physical activity:    Days per week: Not on file    Minutes per session: Not on file  . Stress: Not on file  Relationships  . Social connections:    Talks on phone: Not on file    Gets together: Not on file    Attends religious service: Not on file    Active member of club or organization: Not on  file    Attends meetings of clubs or organizations: Not on file    Relationship status: Not on file  . Intimate partner violence:    Fear of current or ex partner: Not on file    Emotionally abused: Not on file    Physically abused: Not on file    Forced sexual activity: Not on file  Other Topics Concern  . Not on file  Social History Narrative  . Not on file    Outpatient Encounter Medications as of 07/31/2018  Medication Sig  . amLODipine (NORVASC) 5 MG tablet TAKE 1 TABLET BY MOUTH DAILY  . aspirin 325 MG tablet Take 325 mg by mouth 2 (two) times daily.  . Ibuprofen (ADVIL) 200 MG CAPS Take 2 capsules by mouth at bedtime.  Marland Kitchen liothyronine (CYTOMEL) 5 MCG tablet TAKE 1 TABLET BY MOUTH DAILY  . megestrol (MEGACE ORAL) 40 MG/ML suspension Take 10 mLs (400 mg total) by mouth 2 (two) times daily.  . multivitamin-lutein (OCUVITE-LUTEIN) CAPS capsule Take by mouth.  . olmesartan (BENICAR) 40 MG tablet TAKE 1 TABLET BY MOUTH EVERY DAY  . Polyethyl Glycol-Propyl Glycol 0.4-0.3 % SOLN   . pregabalin (LYRICA) 150 MG capsule Take 1 capsule (150 mg total) by mouth at bedtime.  Marland Kitchen  Sennosides-Docusate Sodium (SENNA S PO) Take by mouth at bedtime.  Marland Kitchen SYNTHROID 150 MCG tablet Take 1 tablet (150 mcg total) by mouth daily before breakfast.  . UNABLE TO FIND Med Name: Garlic take one pill daily  . vitamin C (ASCORBIC ACID) 500 MG tablet Take by mouth.  . Vitamin D, Ergocalciferol, (DRISDOL) 50000 units CAPS capsule Take 50,000 Units by mouth once a week.  . zinc gluconate 50 MG tablet Take by mouth.  Marland Kitchen amoxicillin-clavulanate (AUGMENTIN) 875-125 MG tablet Take 1 tablet by mouth 2 (two) times daily for 7 days.  Marland Kitchen azithromycin (ZITHROMAX Z-PAK) 250 MG tablet As directed   Facility-Administered Encounter Medications as of 07/31/2018  Medication  . cyanocobalamin ((VITAMIN B-12)) injection 1,000 mcg    Allergies  Allergen Reactions  . Flax Seed  [Linseed Oil] Itching and Rash    Blood in stool  .  Flaxseed (Linseed) Itching, Other (See Comments) and Rash    Blood in stool  . Atorvastatin Other (See Comments)     Back pain   . Chlorthalidone Other (See Comments)    Dry eye (Eyelid stuck to eyeball)  . Conjugated Estrogens Other (See Comments)    Can't remember reaction   . Ezetimibe Other (See Comments)    Dizziness, Fatigue, numbness in hands and arms, irregular heart beat, tightness in chest  . Lavender Oil Other (See Comments)    Dizziness, Head felt dizzy and foggy, left arm pain, fatigue, off balance  . Metoprolol Other (See Comments)    Headache,  Insomnia, Unstable gait, Lethargy, intermittent SOB.  (Last dose 07/24/2014)  . Monascus Purpureus Went Yeast Nausea Only and Other (See Comments)    Off balance, head felt heavy, aching in shoulders, fatigue, tightness in chest   . Simvastatin     Pain in back of head and neck   . Statins Other (See Comments)    Bladder infections Pain in back of head and neck, UTI, back pain   . Typhoid Vaccines Other (See Comments)    redness  . Bio-Flax   . Prenatal Vit-Fe Psac Cmplx-Fa     Constipation with all Iron    Review of Systems  Constitutional: Positive for activity change, appetite change, chills, fatigue and fever.  HENT: Positive for rhinorrhea and voice change.   Respiratory: Positive for cough and shortness of breath (intermittent). Negative for chest tightness and wheezing.   Cardiovascular: Negative for chest pain, palpitations and leg swelling.  Gastrointestinal: Positive for diarrhea (intermittent with increased flatulence).  Musculoskeletal: Positive for myalgias.  Neurological: Positive for weakness (generalized). Negative for dizziness, syncope, light-headedness, numbness and headaches.  Psychiatric/Behavioral: Negative for confusion.  All other systems reviewed and are negative.       Objective:    BP 132/83   Pulse 74   Temp 98 F (36.7 C) (Oral)   SpO2 95%    Wt Readings from Last 3 Encounters:   05/02/18 98 lb 12.8 oz (44.8 kg)  04/12/18 99 lb (44.9 kg)  04/05/18 99 lb (44.9 kg)    Physical Exam Vitals signs and nursing note reviewed.  Constitutional:      General: She is in acute distress (mild).     Appearance: She is well-developed and well-groomed. She is ill-appearing. She is not toxic-appearing or diaphoretic.  HENT:     Head: Normocephalic and atraumatic.     Jaw: There is normal jaw occlusion.     Right Ear: Hearing, tympanic membrane, ear canal and external ear normal.  Left Ear: Hearing, tympanic membrane, ear canal and external ear normal.     Nose: Congestion and rhinorrhea present. Rhinorrhea is clear.     Right Sinus: No maxillary sinus tenderness or frontal sinus tenderness.     Left Sinus: No maxillary sinus tenderness or frontal sinus tenderness.     Mouth/Throat:     Lips: Pink.     Mouth: Mucous membranes are moist.     Pharynx: Oropharynx is clear. Uvula midline. No pharyngeal swelling, oropharyngeal exudate, posterior oropharyngeal erythema or uvula swelling.     Tonsils: No tonsillar exudate or tonsillar abscesses.  Eyes:     General: Lids are normal.     Conjunctiva/sclera: Conjunctivae normal.     Pupils: Pupils are equal, round, and reactive to light.  Neck:     Trachea: Trachea normal.  Cardiovascular:     Rate and Rhythm: Normal rate and regular rhythm.     Heart sounds: Normal heart sounds. No murmur. No friction rub. No gallop.   Pulmonary:     Effort: Pulmonary effort is normal. No respiratory distress.     Breath sounds: Examination of the left-upper field reveals rhonchi. Examination of the left-middle field reveals rhonchi. Examination of the right-lower field reveals wheezing. Examination of the left-lower field reveals wheezing and rhonchi. Wheezing (mild) and rhonchi present. No rales.  Abdominal:     General: Bowel sounds are normal. There is no distension.     Palpations: Abdomen is soft.     Tenderness: There is no abdominal  tenderness.  Lymphadenopathy:     Cervical: No cervical adenopathy.  Skin:    General: Skin is warm and dry.     Capillary Refill: Capillary refill takes less than 2 seconds.     Coloration: Skin is pale. Skin is not cyanotic.  Neurological:     Mental Status: She is alert and oriented to person, place, and time.  Psychiatric:        Mood and Affect: Mood normal.        Behavior: Behavior normal. Behavior is cooperative.        Thought Content: Thought content normal.        Judgment: Judgment normal.     Results for orders placed or performed in visit on 02/14/18  Folate  Result Value Ref Range   Folate 5.6 >3.0 ng/mL  Sedimentation rate  Result Value Ref Range   Sed Rate 11 0 - 40 mm/hr  Vitamin B12  Result Value Ref Range   Vitamin B-12 506 232 - 1,245 pg/mL  CMP14+EGFR  Result Value Ref Range   Glucose 138 (H) 65 - 99 mg/dL   BUN 18 8 - 27 mg/dL   Creatinine, Ser 0.77 0.57 - 1.00 mg/dL   GFR calc non Af Amer 70 >59 mL/min/1.73   GFR calc Af Amer 80 >59 mL/min/1.73   BUN/Creatinine Ratio 23 12 - 28   Sodium 143 134 - 144 mmol/L   Potassium 4.5 3.5 - 5.2 mmol/L   Chloride 105 96 - 106 mmol/L   CO2 23 20 - 29 mmol/L   Calcium 9.2 8.7 - 10.3 mg/dL   Total Protein 6.4 6.0 - 8.5 g/dL   Albumin 3.9 3.5 - 4.7 g/dL   Globulin, Total 2.5 1.5 - 4.5 g/dL   Albumin/Globulin Ratio 1.6 1.2 - 2.2   Bilirubin Total 0.6 0.0 - 1.2 mg/dL   Alkaline Phosphatase 98 39 - 117 IU/L   AST 13 0 - 40 IU/L  ALT 10 0 - 32 IU/L  Lipid panel  Result Value Ref Range   Cholesterol, Total 212 (H) 100 - 199 mg/dL   Triglycerides 116 0 - 149 mg/dL   HDL 47 >39 mg/dL   VLDL Cholesterol Cal 23 5 - 40 mg/dL   LDL Calculated 142 (H) 0 - 99 mg/dL   Chol/HDL Ratio 4.5 (H) 0.0 - 4.4 ratio  T4, Free  Result Value Ref Range   Free T4 1.57 0.82 - 1.77 ng/dL  T3, free  Result Value Ref Range   T3, Free 2.1 2.0 - 4.4 pg/mL  Specimen status report  Result Value Ref Range   specimen status report  Comment      X-Ray: Chest: Compared to 2012 film. Infiltrates LUL consistent with LUL pneumonia. Moderate amount of stool and gas. No obvious bowel obstruction. Preliminary x-ray reading by Monia Pouch, FNP-C, [facility].   Pertinent labs & imaging results that were available during my care of the patient were reviewed by me and considered in my medical decision making.  Assessment & Plan:  Norleen was seen today for chest congestion and cough.  Diagnoses and all orders for this visit:  Cough -     DG Chest 2 View; Future  Community acquired pneumonia of left upper lobe of lung ([place]) Due to cough, weakness, intermittent shortness of breath, fatigue, and radiology findings will treat for CAP with Augmentin and a macrolide. Pt to return in 4 days for reevaluation. Aware of signs and symptoms that warrant emergent evaluation. Report any new or worsening symptoms. Will notify pt if radiology reading differs.  -     amoxicillin-clavulanate (AUGMENTIN) 875-125 MG tablet; Take 1 tablet by mouth 2 (two) times daily for 7 days. -     azithromycin (ZITHROMAX Z-PAK) 250 MG tablet; As directed  Slow transit constipation Miralax clean out discussed. Pt aware of signs and symptoms that warrant emergent evaluation. Increase water and fiber intake.     Continue all other maintenance medications.  Follow up plan: Return in about 4 days (around 08/04/2018), or if symptoms worsen or fail to improve.  Educational handout given for Miralax clean out, CAP  The above assessment and management plan was discussed with the patient. The patient verbalized understanding of and has agreed to the management plan. Patient is aware to call the clinic if symptoms persist or worsen. Patient is aware when to return to the clinic for a follow-up visit. Patient educated on when it is appropriate to go to the emergency department.   Monia Pouch, FNP-C [facility] [phone]

## 2018-07-31 NOTE — Telephone Encounter (Signed)
Pt's daughter aware.

## 2018-07-31 NOTE — Patient Instructions (Addendum)
Thank you for coming in to clinic today.  1. Your symptoms are consistent with Constipation, likely cause of your General Abdominal Pain / Cramping. 2. Start with Miralax, prescription was sent to pharmacy. First dose 83g (4 capfuls) in 32oz water over 1 to 2 hours for clean out. Next day start 17g or 1 capful daily, may adjust dose up or down by half a capful every few days. Recommend to take this medicine daily for next 1-2 weeks, you may need to use it longer if needed. - Goal is to have soft regular bowel movement 1-3x daily, if too runny or diarrhea, then reduce dose of the medicine to every other day.  Improve water intake, hydration will help Also recommend increased vegetables, fruits, fiber intake Can try daily Metamucil or Fiber supplement at pharmacy over the counter  Follow-up if symptoms are not improving with bowel movements, or if pain worsens, develop fevers, nausea, vomiting.  Please schedule a follow-up appointment with Monia Pouch, FNP, in 1 month to follow-up Constipation  If you have any other questions or concerns, please feel free to call the clinic to contact me. You may also schedule an earlier appointment if necessary.  However, if your symptoms get significantly worse, please go to the Emergency Department to seek immediate medical attention.    Community-Acquired Pneumonia, Adult Pneumonia is an infection of the lungs. It causes swelling in the airways of the lungs. Mucus and fluid may also build up inside the airways. One type of pneumonia can happen while a person is in a hospital. A different type can happen when a person is not in a hospital (community-acquired pneumonia).  What are the causes?  This condition is caused by germs (viruses, bacteria, or fungi). Some types of germs can be passed from one person to another. This can happen when you breathe in droplets from the cough or sneeze of an infected person. What increases the risk? You are more likely  to develop this condition if you:  Have a long-term (chronic) disease, such as: ? Chronic obstructive pulmonary disease (COPD). ? Asthma. ? Cystic fibrosis. ? Congestive heart failure. ? Diabetes. ? Kidney disease.  Have HIV.  Have sickle cell disease.  Have had your spleen removed.  Do not take good care of your teeth and mouth (poor dental hygiene).  Have a medical condition that increases the risk of breathing in droplets from your own mouth and nose.  Have a weakened body defense system (immune system).  Are a smoker.  Travel to areas where the germs that cause this illness are common.  Are around certain animals or the places they live. What are the signs or symptoms?  A dry cough.  A wet (productive) cough.  Fever.  Sweating.  Chest pain. This often happens when breathing deeply or coughing.  Fast breathing or trouble breathing.  Shortness of breath.  Shaking chills.  Feeling tired (fatigue).  Muscle aches. How is this treated? Treatment for this condition depends on many things. Most adults can be treated at home. In some cases, treatment must happen in a hospital. Treatment may include:  Medicines given by mouth or through an IV tube.  Being given extra oxygen.  Respiratory therapy. In rare cases, treatment for very bad pneumonia may include:  Using a machine to help you breathe.  Having a procedure to remove fluid from around your lungs. Follow these instructions at home: Medicines  Take over-the-counter and prescription medicines only as told by your doctor. ?  Only take cough medicine if you are losing sleep.  If you were prescribed an antibiotic medicine, take it as told by your doctor. Do not stop taking the antibiotic even if you start to feel better. General instructions   Sleep with your head and neck raised (elevated). You can do this by sleeping in a recliner or by putting a few pillows under your head.  Rest as needed. Get at  least 8 hours of sleep each night.  Drink enough water to keep your pee (urine) pale yellow.  Eat a healthy diet that includes plenty of vegetables, fruits, whole grains, low-fat dairy products, and lean protein.  Do not use any products that contain nicotine or tobacco. These include cigarettes, e-cigarettes, and chewing tobacco. If you need help quitting, ask your doctor.  Keep all follow-up visits as told by your doctor. This is important. How is this prevented? A shot (vaccine) can help prevent pneumonia. Shots are often suggested for:  People older than 83 years of age.  People older than 83 years of age who: ? Are having cancer treatment. ? Have long-term (chronic) lung disease. ? Have problems with their body's defense system. You may also prevent pneumonia if you take these actions:  Get the flu (influenza) shot every year.  Go to the dentist as often as told.  Wash your hands often. If you cannot use soap and water, use hand sanitizer. Contact a doctor if:  You have a fever.  You lose sleep because your cough medicine does not help. Get help right away if:  You are short of breath and it gets worse.  You have more chest pain.  Your sickness gets worse. This is very serious if: ? You are an older adult. ? Your body's defense system is weak.  You cough up blood. Summary  Pneumonia is an infection of the lungs.  Most adults can be treated at home. Some will need treatment in a hospital.  Drink enough water to keep your pee pale yellow.  Get at least 8 hours of sleep each night. This information is not intended to replace advice given to you by your health care provider. Make sure you discuss any questions you have with your health care provider. Document Released: 12/29/2007 Document Revised: 03/09/2018 Document Reviewed: 03/09/2018 [employer]vier Interactive Patient Education  2019 [employer].

## 2018-08-01 DIAGNOSIS — J209 Acute bronchitis, unspecified: Secondary | ICD-10-CM | POA: Diagnosis not present

## 2018-08-01 DIAGNOSIS — R918 Other nonspecific abnormal finding of lung field: Secondary | ICD-10-CM | POA: Diagnosis not present

## 2018-08-01 DIAGNOSIS — R5383 Other fatigue: Secondary | ICD-10-CM | POA: Diagnosis not present

## 2018-08-01 DIAGNOSIS — I959 Hypotension, unspecified: Secondary | ICD-10-CM | POA: Diagnosis not present

## 2018-08-01 DIAGNOSIS — I10 Essential (primary) hypertension: Secondary | ICD-10-CM | POA: Diagnosis not present

## 2018-08-01 DIAGNOSIS — R0902 Hypoxemia: Secondary | ICD-10-CM | POA: Diagnosis not present

## 2018-08-01 DIAGNOSIS — Z8673 Personal history of transient ischemic attack (TIA), and cerebral infarction without residual deficits: Secondary | ICD-10-CM | POA: Diagnosis not present

## 2018-08-01 DIAGNOSIS — J189 Pneumonia, unspecified organism: Secondary | ICD-10-CM | POA: Diagnosis not present

## 2018-08-01 DIAGNOSIS — Z7982 Long term (current) use of aspirin: Secondary | ICD-10-CM | POA: Diagnosis not present

## 2018-08-01 DIAGNOSIS — R400 Somnolence: Secondary | ICD-10-CM | POA: Diagnosis not present

## 2018-08-01 DIAGNOSIS — G610 Guillain-Barre syndrome: Secondary | ICD-10-CM | POA: Diagnosis not present

## 2018-08-01 DIAGNOSIS — E872 Acidosis: Secondary | ICD-10-CM | POA: Diagnosis not present

## 2018-08-01 DIAGNOSIS — R2681 Unsteadiness on feet: Secondary | ICD-10-CM | POA: Diagnosis not present

## 2018-08-01 DIAGNOSIS — G379 Demyelinating disease of central nervous system, unspecified: Secondary | ICD-10-CM | POA: Diagnosis present

## 2018-08-01 DIAGNOSIS — R531 Weakness: Secondary | ICD-10-CM | POA: Diagnosis not present

## 2018-08-01 DIAGNOSIS — E440 Moderate protein-calorie malnutrition: Secondary | ICD-10-CM | POA: Diagnosis not present

## 2018-08-01 DIAGNOSIS — Z681 Body mass index (BMI) 19 or less, adult: Secondary | ICD-10-CM | POA: Diagnosis not present

## 2018-08-01 DIAGNOSIS — I712 Thoracic aortic aneurysm, without rupture: Secondary | ICD-10-CM | POA: Diagnosis not present

## 2018-08-01 DIAGNOSIS — G629 Polyneuropathy, unspecified: Secondary | ICD-10-CM | POA: Diagnosis not present

## 2018-08-01 DIAGNOSIS — T424X5A Adverse effect of benzodiazepines, initial encounter: Secondary | ICD-10-CM | POA: Diagnosis not present

## 2018-08-01 DIAGNOSIS — G4719 Other hypersomnia: Secondary | ICD-10-CM | POA: Diagnosis not present

## 2018-08-01 DIAGNOSIS — I739 Peripheral vascular disease, unspecified: Secondary | ICD-10-CM | POA: Diagnosis not present

## 2018-08-01 DIAGNOSIS — E43 Unspecified severe protein-calorie malnutrition: Secondary | ICD-10-CM | POA: Diagnosis not present

## 2018-08-01 DIAGNOSIS — J9692 Respiratory failure, unspecified with hypercapnia: Secondary | ICD-10-CM | POA: Diagnosis not present

## 2018-08-01 DIAGNOSIS — R0689 Other abnormalities of breathing: Secondary | ICD-10-CM | POA: Diagnosis not present

## 2018-08-01 DIAGNOSIS — Z79899 Other long term (current) drug therapy: Secondary | ICD-10-CM | POA: Diagnosis not present

## 2018-08-01 DIAGNOSIS — J9612 Chronic respiratory failure with hypercapnia: Secondary | ICD-10-CM | POA: Diagnosis present

## 2018-08-01 DIAGNOSIS — E039 Hypothyroidism, unspecified: Secondary | ICD-10-CM | POA: Diagnosis present

## 2018-08-01 DIAGNOSIS — E876 Hypokalemia: Secondary | ICD-10-CM | POA: Diagnosis not present

## 2018-08-01 DIAGNOSIS — J9622 Acute and chronic respiratory failure with hypercapnia: Secondary | ICD-10-CM | POA: Diagnosis not present

## 2018-08-01 DIAGNOSIS — E860 Dehydration: Secondary | ICD-10-CM | POA: Diagnosis not present

## 2018-08-01 DIAGNOSIS — R0602 Shortness of breath: Secondary | ICD-10-CM | POA: Diagnosis not present

## 2018-08-01 DIAGNOSIS — J9611 Chronic respiratory failure with hypoxia: Secondary | ICD-10-CM | POA: Diagnosis not present

## 2018-08-01 DIAGNOSIS — G6181 Chronic inflammatory demyelinating polyneuritis: Secondary | ICD-10-CM | POA: Diagnosis not present

## 2018-08-01 DIAGNOSIS — G603 Idiopathic progressive neuropathy: Secondary | ICD-10-CM | POA: Diagnosis not present

## 2018-08-01 DIAGNOSIS — R000 Tachycardia, unspecified: Secondary | ICD-10-CM | POA: Diagnosis not present

## 2018-08-04 ENCOUNTER — Ambulatory Visit: Payer: Medicare Other | Admitting: Family Medicine

## 2018-08-04 DIAGNOSIS — R0602 Shortness of breath: Secondary | ICD-10-CM | POA: Diagnosis not present

## 2018-08-04 DIAGNOSIS — R918 Other nonspecific abnormal finding of lung field: Secondary | ICD-10-CM | POA: Diagnosis not present

## 2018-08-04 DIAGNOSIS — G603 Idiopathic progressive neuropathy: Secondary | ICD-10-CM | POA: Diagnosis not present

## 2018-08-04 DIAGNOSIS — R2681 Unsteadiness on feet: Secondary | ICD-10-CM | POA: Diagnosis not present

## 2018-08-04 DIAGNOSIS — G610 Guillain-Barre syndrome: Secondary | ICD-10-CM | POA: Diagnosis not present

## 2018-08-04 DIAGNOSIS — I739 Peripheral vascular disease, unspecified: Secondary | ICD-10-CM | POA: Diagnosis not present

## 2018-08-05 DIAGNOSIS — E039 Hypothyroidism, unspecified: Secondary | ICD-10-CM | POA: Diagnosis not present

## 2018-08-05 DIAGNOSIS — I10 Essential (primary) hypertension: Secondary | ICD-10-CM | POA: Diagnosis not present

## 2018-08-05 DIAGNOSIS — R531 Weakness: Secondary | ICD-10-CM | POA: Diagnosis not present

## 2018-08-05 DIAGNOSIS — E876 Hypokalemia: Secondary | ICD-10-CM | POA: Diagnosis not present

## 2018-08-05 DIAGNOSIS — J9621 Acute and chronic respiratory failure with hypoxia: Secondary | ICD-10-CM | POA: Diagnosis not present

## 2018-08-06 DIAGNOSIS — G629 Polyneuropathy, unspecified: Secondary | ICD-10-CM | POA: Diagnosis not present

## 2018-08-06 DIAGNOSIS — R836 Abnormal cytological findings in cerebrospinal fluid: Secondary | ICD-10-CM | POA: Diagnosis not present

## 2018-08-06 DIAGNOSIS — N186 End stage renal disease: Secondary | ICD-10-CM | POA: Diagnosis not present

## 2018-08-06 DIAGNOSIS — R269 Unspecified abnormalities of gait and mobility: Secondary | ICD-10-CM | POA: Diagnosis not present

## 2018-08-06 DIAGNOSIS — R9389 Abnormal findings on diagnostic imaging of other specified body structures: Secondary | ICD-10-CM | POA: Diagnosis not present

## 2018-08-06 DIAGNOSIS — J320 Chronic maxillary sinusitis: Secondary | ICD-10-CM | POA: Diagnosis not present

## 2018-08-06 DIAGNOSIS — Z79899 Other long term (current) drug therapy: Secondary | ICD-10-CM | POA: Diagnosis not present

## 2018-08-06 DIAGNOSIS — J9811 Atelectasis: Secondary | ICD-10-CM | POA: Diagnosis not present

## 2018-08-06 DIAGNOSIS — J189 Pneumonia, unspecified organism: Secondary | ICD-10-CM | POA: Diagnosis present

## 2018-08-06 DIAGNOSIS — R29898 Other symptoms and signs involving the musculoskeletal system: Secondary | ICD-10-CM | POA: Diagnosis not present

## 2018-08-06 DIAGNOSIS — J9621 Acute and chronic respiratory failure with hypoxia: Secondary | ICD-10-CM | POA: Diagnosis not present

## 2018-08-06 DIAGNOSIS — Z7982 Long term (current) use of aspirin: Secondary | ICD-10-CM | POA: Diagnosis not present

## 2018-08-06 DIAGNOSIS — M5136 Other intervertebral disc degeneration, lumbar region: Secondary | ICD-10-CM | POA: Diagnosis not present

## 2018-08-06 DIAGNOSIS — M47892 Other spondylosis, cervical region: Secondary | ICD-10-CM | POA: Diagnosis not present

## 2018-08-06 DIAGNOSIS — Z91018 Allergy to other foods: Secondary | ICD-10-CM | POA: Diagnosis not present

## 2018-08-06 DIAGNOSIS — M50323 Other cervical disc degeneration at C6-C7 level: Secondary | ICD-10-CM | POA: Diagnosis not present

## 2018-08-06 DIAGNOSIS — R531 Weakness: Secondary | ICD-10-CM | POA: Diagnosis not present

## 2018-08-06 DIAGNOSIS — M50322 Other cervical disc degeneration at C5-C6 level: Secondary | ICD-10-CM | POA: Diagnosis not present

## 2018-08-06 DIAGNOSIS — E876 Hypokalemia: Secondary | ICD-10-CM | POA: Diagnosis not present

## 2018-08-06 DIAGNOSIS — M4804 Spinal stenosis, thoracic region: Secondary | ICD-10-CM | POA: Diagnosis not present

## 2018-08-06 DIAGNOSIS — M5134 Other intervertebral disc degeneration, thoracic region: Secondary | ICD-10-CM | POA: Diagnosis not present

## 2018-08-06 DIAGNOSIS — R918 Other nonspecific abnormal finding of lung field: Secondary | ICD-10-CM | POA: Diagnosis not present

## 2018-08-06 DIAGNOSIS — Z887 Allergy status to serum and vaccine status: Secondary | ICD-10-CM | POA: Diagnosis not present

## 2018-08-06 DIAGNOSIS — I10 Essential (primary) hypertension: Secondary | ICD-10-CM | POA: Diagnosis not present

## 2018-08-06 DIAGNOSIS — R0602 Shortness of breath: Secondary | ICD-10-CM | POA: Diagnosis not present

## 2018-08-06 DIAGNOSIS — E559 Vitamin D deficiency, unspecified: Secondary | ICD-10-CM | POA: Diagnosis not present

## 2018-08-06 DIAGNOSIS — M4316 Spondylolisthesis, lumbar region: Secondary | ICD-10-CM | POA: Diagnosis not present

## 2018-08-06 DIAGNOSIS — M5186 Other intervertebral disc disorders, lumbar region: Secondary | ICD-10-CM | POA: Diagnosis not present

## 2018-08-06 DIAGNOSIS — M47896 Other spondylosis, lumbar region: Secondary | ICD-10-CM | POA: Diagnosis not present

## 2018-08-06 DIAGNOSIS — E872 Acidosis: Secondary | ICD-10-CM | POA: Diagnosis not present

## 2018-08-06 DIAGNOSIS — R140 Abdominal distension (gaseous): Secondary | ICD-10-CM | POA: Diagnosis not present

## 2018-08-06 DIAGNOSIS — M50321 Other cervical disc degeneration at C4-C5 level: Secondary | ICD-10-CM | POA: Diagnosis not present

## 2018-08-06 DIAGNOSIS — K5900 Constipation, unspecified: Secondary | ICD-10-CM | POA: Diagnosis not present

## 2018-08-06 DIAGNOSIS — G319 Degenerative disease of nervous system, unspecified: Secondary | ICD-10-CM | POA: Diagnosis not present

## 2018-08-06 DIAGNOSIS — Z888 Allergy status to other drugs, medicaments and biological substances status: Secondary | ICD-10-CM | POA: Diagnosis not present

## 2018-08-06 DIAGNOSIS — R5383 Other fatigue: Secondary | ICD-10-CM | POA: Diagnosis not present

## 2018-08-06 DIAGNOSIS — J9622 Acute and chronic respiratory failure with hypercapnia: Secondary | ICD-10-CM | POA: Diagnosis not present

## 2018-08-06 DIAGNOSIS — M47894 Other spondylosis, thoracic region: Secondary | ICD-10-CM | POA: Diagnosis not present

## 2018-08-06 DIAGNOSIS — G6181 Chronic inflammatory demyelinating polyneuritis: Secondary | ICD-10-CM | POA: Diagnosis not present

## 2018-08-06 DIAGNOSIS — Z91048 Other nonmedicinal substance allergy status: Secondary | ICD-10-CM | POA: Diagnosis not present

## 2018-08-06 DIAGNOSIS — E039 Hypothyroidism, unspecified: Secondary | ICD-10-CM | POA: Diagnosis not present

## 2018-08-06 DIAGNOSIS — I080 Rheumatic disorders of both mitral and aortic valves: Secondary | ICD-10-CM | POA: Diagnosis not present

## 2018-08-06 DIAGNOSIS — R109 Unspecified abdominal pain: Secondary | ICD-10-CM | POA: Diagnosis not present

## 2018-08-06 DIAGNOSIS — G9340 Encephalopathy, unspecified: Secondary | ICD-10-CM | POA: Diagnosis present

## 2018-08-16 MED ORDER — GENERIC EXTERNAL MEDICATION
5.00 | Status: DC
Start: ? — End: 2018-08-16

## 2018-08-16 MED ORDER — CALCIUM CARBONATE ANTACID 500 MG PO CHEW
500.00 | CHEWABLE_TABLET | ORAL | Status: DC
Start: ? — End: 2018-08-16

## 2018-08-16 MED ORDER — AMLODIPINE BESYLATE 5 MG PO TABS
5.00 | ORAL_TABLET | ORAL | Status: DC
Start: 2018-08-16 — End: 2018-08-16

## 2018-08-16 MED ORDER — GENERIC EXTERNAL MEDICATION
10.00 | Status: DC
Start: ? — End: 2018-08-16

## 2018-08-16 MED ORDER — LIOTHYRONINE SODIUM 5 MCG PO TABS
5.00 | ORAL_TABLET | ORAL | Status: DC
Start: 2018-08-16 — End: 2018-08-16

## 2018-08-16 MED ORDER — PREDNISONE 20 MG PO TABS
40.00 | ORAL_TABLET | ORAL | Status: DC
Start: 2018-08-16 — End: 2018-08-16

## 2018-08-16 MED ORDER — LEVOTHYROXINE SODIUM 150 MCG PO TABS
150.00 | ORAL_TABLET | ORAL | Status: DC
Start: 2018-08-16 — End: 2018-08-16

## 2018-08-16 MED ORDER — NITROGLYCERIN 0.4 MG SL SUBL
.40 | SUBLINGUAL_TABLET | SUBLINGUAL | Status: DC
Start: ? — End: 2018-08-16

## 2018-08-16 MED ORDER — CARVEDILOL 6.25 MG PO TABS
6.25 | ORAL_TABLET | ORAL | Status: DC
Start: 2018-08-15 — End: 2018-08-16

## 2018-08-16 MED ORDER — LOSARTAN POTASSIUM 100 MG PO TABS
100.00 | ORAL_TABLET | ORAL | Status: DC
Start: 2018-08-16 — End: 2018-08-16

## 2018-08-16 MED ORDER — SODIUM CHLORIDE 0.9 % IV SOLN
10.00 | INTRAVENOUS | Status: DC
Start: ? — End: 2018-08-16

## 2018-08-16 MED ORDER — IPRATROPIUM-ALBUTEROL 0.5-2.5 (3) MG/3ML IN SOLN
3.00 | RESPIRATORY_TRACT | Status: DC
Start: ? — End: 2018-08-16

## 2018-08-16 MED ORDER — ASPIRIN 81 MG PO CHEW
81.00 | CHEWABLE_TABLET | ORAL | Status: DC
Start: ? — End: 2018-08-16

## 2018-08-16 MED ORDER — VITAMIN D3 25 MCG (1000 UNIT) PO TABS
2000.00 | ORAL_TABLET | ORAL | Status: DC
Start: 2018-08-16 — End: 2018-08-16

## 2018-08-16 MED ORDER — GUAIFENESIN 400 MG PO TABS
400.00 | ORAL_TABLET | ORAL | Status: DC
Start: 2018-08-15 — End: 2018-08-16

## 2018-08-16 MED ORDER — ENOXAPARIN SODIUM 40 MG/0.4ML ~~LOC~~ SOLN
40.00 | SUBCUTANEOUS | Status: DC
Start: 2018-08-16 — End: 2018-08-16

## 2018-08-16 MED ORDER — PREGABALIN 75 MG PO CAPS
75.00 | ORAL_CAPSULE | ORAL | Status: DC
Start: 2018-08-16 — End: 2018-08-16

## 2018-08-16 MED ORDER — LORATADINE 10 MG PO TABS
10.00 | ORAL_TABLET | ORAL | Status: DC
Start: 2018-08-16 — End: 2018-08-16

## 2018-08-16 MED ORDER — QUETIAPINE FUMARATE 25 MG PO TABS
25.00 | ORAL_TABLET | ORAL | Status: DC
Start: 2018-08-15 — End: 2018-08-16

## 2018-08-16 MED ORDER — MELATONIN 3 MG PO TABS
3.00 | ORAL_TABLET | ORAL | Status: DC
Start: ? — End: 2018-08-16

## 2018-08-16 MED ORDER — VITAMIN C 500 MG PO TABS
500.00 | ORAL_TABLET | ORAL | Status: DC
Start: 2018-08-16 — End: 2018-08-16

## 2018-08-16 MED ORDER — FAMOTIDINE 20 MG PO TABS
20.00 | ORAL_TABLET | ORAL | Status: DC
Start: 2018-08-16 — End: 2018-08-16

## 2018-08-18 DIAGNOSIS — J9621 Acute and chronic respiratory failure with hypoxia: Secondary | ICD-10-CM | POA: Diagnosis not present

## 2018-08-18 DIAGNOSIS — I10 Essential (primary) hypertension: Secondary | ICD-10-CM | POA: Diagnosis not present

## 2018-08-18 DIAGNOSIS — Z9981 Dependence on supplemental oxygen: Secondary | ICD-10-CM | POA: Diagnosis not present

## 2018-08-18 DIAGNOSIS — Z79899 Other long term (current) drug therapy: Secondary | ICD-10-CM | POA: Diagnosis not present

## 2018-08-18 DIAGNOSIS — J9622 Acute and chronic respiratory failure with hypercapnia: Secondary | ICD-10-CM | POA: Diagnosis not present

## 2018-08-18 DIAGNOSIS — G6181 Chronic inflammatory demyelinating polyneuritis: Secondary | ICD-10-CM | POA: Diagnosis not present

## 2018-08-21 ENCOUNTER — Ambulatory Visit: Payer: Medicare Other | Admitting: Family Medicine

## 2018-08-21 DIAGNOSIS — J9621 Acute and chronic respiratory failure with hypoxia: Secondary | ICD-10-CM | POA: Diagnosis not present

## 2018-08-21 DIAGNOSIS — Z79899 Other long term (current) drug therapy: Secondary | ICD-10-CM | POA: Diagnosis not present

## 2018-08-21 DIAGNOSIS — I10 Essential (primary) hypertension: Secondary | ICD-10-CM | POA: Diagnosis not present

## 2018-08-21 DIAGNOSIS — G6181 Chronic inflammatory demyelinating polyneuritis: Secondary | ICD-10-CM | POA: Diagnosis not present

## 2018-08-21 DIAGNOSIS — Z9981 Dependence on supplemental oxygen: Secondary | ICD-10-CM | POA: Diagnosis not present

## 2018-08-21 DIAGNOSIS — J9622 Acute and chronic respiratory failure with hypercapnia: Secondary | ICD-10-CM | POA: Diagnosis not present

## 2018-08-22 DIAGNOSIS — Z9981 Dependence on supplemental oxygen: Secondary | ICD-10-CM | POA: Diagnosis not present

## 2018-08-22 DIAGNOSIS — G6181 Chronic inflammatory demyelinating polyneuritis: Secondary | ICD-10-CM | POA: Diagnosis not present

## 2018-08-22 DIAGNOSIS — Z79899 Other long term (current) drug therapy: Secondary | ICD-10-CM | POA: Diagnosis not present

## 2018-08-22 DIAGNOSIS — J9622 Acute and chronic respiratory failure with hypercapnia: Secondary | ICD-10-CM | POA: Diagnosis not present

## 2018-08-22 DIAGNOSIS — J9621 Acute and chronic respiratory failure with hypoxia: Secondary | ICD-10-CM | POA: Diagnosis not present

## 2018-08-22 DIAGNOSIS — I10 Essential (primary) hypertension: Secondary | ICD-10-CM | POA: Diagnosis not present

## 2018-08-23 ENCOUNTER — Ambulatory Visit: Payer: Medicare Other | Admitting: Family Medicine

## 2018-08-23 DIAGNOSIS — R0902 Hypoxemia: Secondary | ICD-10-CM | POA: Diagnosis not present

## 2018-08-23 DIAGNOSIS — Z888 Allergy status to other drugs, medicaments and biological substances status: Secondary | ICD-10-CM | POA: Diagnosis not present

## 2018-08-23 DIAGNOSIS — Z7401 Bed confinement status: Secondary | ICD-10-CM | POA: Diagnosis not present

## 2018-08-23 DIAGNOSIS — I10 Essential (primary) hypertension: Secondary | ICD-10-CM | POA: Diagnosis not present

## 2018-08-23 DIAGNOSIS — Z66 Do not resuscitate: Secondary | ICD-10-CM | POA: Diagnosis not present

## 2018-08-23 DIAGNOSIS — R531 Weakness: Secondary | ICD-10-CM | POA: Diagnosis not present

## 2018-08-23 DIAGNOSIS — G6181 Chronic inflammatory demyelinating polyneuritis: Secondary | ICD-10-CM | POA: Diagnosis not present

## 2018-08-23 DIAGNOSIS — Z9981 Dependence on supplemental oxygen: Secondary | ICD-10-CM | POA: Diagnosis not present

## 2018-08-23 DIAGNOSIS — I7090 Unspecified atherosclerosis: Secondary | ICD-10-CM | POA: Diagnosis not present

## 2018-08-23 DIAGNOSIS — J9622 Acute and chronic respiratory failure with hypercapnia: Secondary | ICD-10-CM | POA: Diagnosis not present

## 2018-08-23 DIAGNOSIS — R4182 Altered mental status, unspecified: Secondary | ICD-10-CM | POA: Diagnosis not present

## 2018-08-23 DIAGNOSIS — J9621 Acute and chronic respiratory failure with hypoxia: Secondary | ICD-10-CM | POA: Diagnosis not present

## 2018-08-23 DIAGNOSIS — Z7982 Long term (current) use of aspirin: Secondary | ICD-10-CM | POA: Diagnosis not present

## 2018-08-23 DIAGNOSIS — J9692 Respiratory failure, unspecified with hypercapnia: Secondary | ICD-10-CM | POA: Diagnosis not present

## 2018-08-23 DIAGNOSIS — Z79899 Other long term (current) drug therapy: Secondary | ICD-10-CM | POA: Diagnosis not present

## 2018-08-23 DIAGNOSIS — E079 Disorder of thyroid, unspecified: Secondary | ICD-10-CM | POA: Diagnosis not present

## 2018-08-24 DIAGNOSIS — R531 Weakness: Secondary | ICD-10-CM | POA: Diagnosis not present

## 2018-08-24 DIAGNOSIS — E46 Unspecified protein-calorie malnutrition: Secondary | ICD-10-CM | POA: Diagnosis not present

## 2018-08-24 DIAGNOSIS — R0602 Shortness of breath: Secondary | ICD-10-CM | POA: Diagnosis not present

## 2018-08-24 DIAGNOSIS — E039 Hypothyroidism, unspecified: Secondary | ICD-10-CM | POA: Diagnosis not present

## 2018-08-24 DIAGNOSIS — I10 Essential (primary) hypertension: Secondary | ICD-10-CM | POA: Diagnosis not present

## 2018-08-24 DIAGNOSIS — G6181 Chronic inflammatory demyelinating polyneuritis: Secondary | ICD-10-CM | POA: Diagnosis not present

## 2018-08-25 ENCOUNTER — Telehealth: Payer: Self-pay | Admitting: *Deleted

## 2018-08-25 ENCOUNTER — Other Ambulatory Visit: Payer: Self-pay | Admitting: Family Medicine

## 2018-08-25 DIAGNOSIS — R531 Weakness: Secondary | ICD-10-CM | POA: Diagnosis not present

## 2018-08-25 DIAGNOSIS — R0602 Shortness of breath: Secondary | ICD-10-CM | POA: Diagnosis not present

## 2018-08-25 DIAGNOSIS — G6181 Chronic inflammatory demyelinating polyneuritis: Secondary | ICD-10-CM | POA: Diagnosis not present

## 2018-08-25 DIAGNOSIS — E46 Unspecified protein-calorie malnutrition: Secondary | ICD-10-CM | POA: Diagnosis not present

## 2018-08-25 DIAGNOSIS — I10 Essential (primary) hypertension: Secondary | ICD-10-CM | POA: Diagnosis not present

## 2018-08-25 DIAGNOSIS — E039 Hypothyroidism, unspecified: Secondary | ICD-10-CM | POA: Diagnosis not present

## 2018-08-25 MED ORDER — MIRTAZAPINE 15 MG PO TABS: 15.0000 mg | ORAL_TABLET | Freq: Every day | ORAL | 5 refills | Status: AC

## 2018-08-25 NOTE — Telephone Encounter (Signed)
Hospice nurse notified and verbalized understanding ?

## 2018-08-25 NOTE — Telephone Encounter (Signed)
Hospice nurse says family wants rx for Remeron 15mg  [facility] to help her sleep.

## 2018-08-25 NOTE — Telephone Encounter (Signed)
I sent in the requested prescription 

## 2018-08-26 DIAGNOSIS — E039 Hypothyroidism, unspecified: Secondary | ICD-10-CM | POA: Diagnosis not present

## 2018-08-26 DIAGNOSIS — R531 Weakness: Secondary | ICD-10-CM | POA: Diagnosis not present

## 2018-08-26 DIAGNOSIS — R0602 Shortness of breath: Secondary | ICD-10-CM | POA: Diagnosis not present

## 2018-08-26 DIAGNOSIS — I10 Essential (primary) hypertension: Secondary | ICD-10-CM | POA: Diagnosis not present

## 2018-08-26 DIAGNOSIS — E46 Unspecified protein-calorie malnutrition: Secondary | ICD-10-CM | POA: Diagnosis not present

## 2018-08-26 DIAGNOSIS — G6181 Chronic inflammatory demyelinating polyneuritis: Secondary | ICD-10-CM | POA: Diagnosis not present

## 2018-08-27 DIAGNOSIS — I10 Essential (primary) hypertension: Secondary | ICD-10-CM | POA: Diagnosis not present

## 2018-08-27 DIAGNOSIS — R0602 Shortness of breath: Secondary | ICD-10-CM | POA: Diagnosis not present

## 2018-08-27 DIAGNOSIS — E46 Unspecified protein-calorie malnutrition: Secondary | ICD-10-CM | POA: Diagnosis not present

## 2018-08-27 DIAGNOSIS — G6181 Chronic inflammatory demyelinating polyneuritis: Secondary | ICD-10-CM | POA: Diagnosis not present

## 2018-08-27 DIAGNOSIS — E039 Hypothyroidism, unspecified: Secondary | ICD-10-CM | POA: Diagnosis not present

## 2018-08-27 DIAGNOSIS — R531 Weakness: Secondary | ICD-10-CM | POA: Diagnosis not present

## 2018-08-28 ENCOUNTER — Ambulatory Visit (INDEPENDENT_AMBULATORY_CARE_PROVIDER_SITE_OTHER): Payer: Medicare Other

## 2018-08-28 DIAGNOSIS — J9622 Acute and chronic respiratory failure with hypercapnia: Secondary | ICD-10-CM | POA: Diagnosis not present

## 2018-08-28 DIAGNOSIS — I10 Essential (primary) hypertension: Secondary | ICD-10-CM | POA: Diagnosis not present

## 2018-08-28 DIAGNOSIS — Z79899 Other long term (current) drug therapy: Secondary | ICD-10-CM | POA: Diagnosis not present

## 2018-08-28 DIAGNOSIS — R531 Weakness: Secondary | ICD-10-CM | POA: Diagnosis not present

## 2018-08-28 DIAGNOSIS — R0602 Shortness of breath: Secondary | ICD-10-CM | POA: Diagnosis not present

## 2018-08-28 DIAGNOSIS — J9621 Acute and chronic respiratory failure with hypoxia: Secondary | ICD-10-CM

## 2018-08-28 DIAGNOSIS — Z9981 Dependence on supplemental oxygen: Secondary | ICD-10-CM | POA: Diagnosis not present

## 2018-08-28 DIAGNOSIS — E46 Unspecified protein-calorie malnutrition: Secondary | ICD-10-CM | POA: Diagnosis not present

## 2018-08-28 DIAGNOSIS — E039 Hypothyroidism, unspecified: Secondary | ICD-10-CM | POA: Diagnosis not present

## 2018-08-28 DIAGNOSIS — G6181 Chronic inflammatory demyelinating polyneuritis: Secondary | ICD-10-CM

## 2018-09-22 ENCOUNTER — Ambulatory Visit: Payer: Medicare Other

## 2018-09-24 DEATH — deceased

## 2018-10-20 ENCOUNTER — Ambulatory Visit: Payer: Medicare Other

## 2018-11-17 ENCOUNTER — Ambulatory Visit: Payer: Medicare Other

## 2018-12-15 ENCOUNTER — Ambulatory Visit: Payer: Medicare Other

## 2019-01-19 ENCOUNTER — Ambulatory Visit: Payer: Medicare Other
